# Patient Record
Sex: Male | Born: 1935 | Race: White | Hispanic: No | Marital: Married | Smoking: Former smoker
Health system: Southern US, Community
[De-identification: ages and names within clinical notes are randomized; demographics above are authoritative.]

## PROBLEM LIST (undated history)

## (undated) DIAGNOSIS — H3530 Unspecified macular degeneration: Secondary | ICD-10-CM

## (undated) DIAGNOSIS — F32A Depression, unspecified: Secondary | ICD-10-CM

## (undated) DIAGNOSIS — G20A1 Parkinson's disease without dyskinesia, without mention of fluctuations: Secondary | ICD-10-CM

## (undated) DIAGNOSIS — I2510 Atherosclerotic heart disease of native coronary artery without angina pectoris: Secondary | ICD-10-CM

## (undated) DIAGNOSIS — I739 Peripheral vascular disease, unspecified: Secondary | ICD-10-CM

## (undated) DIAGNOSIS — H40059 Ocular hypertension, unspecified eye: Secondary | ICD-10-CM

## (undated) DIAGNOSIS — I10 Essential (primary) hypertension: Secondary | ICD-10-CM

## (undated) DIAGNOSIS — I2699 Other pulmonary embolism without acute cor pulmonale: Secondary | ICD-10-CM

## (undated) DIAGNOSIS — B029 Zoster without complications: Secondary | ICD-10-CM

## (undated) DIAGNOSIS — I714 Abdominal aortic aneurysm, without rupture, unspecified: Secondary | ICD-10-CM

## (undated) DIAGNOSIS — C801 Malignant (primary) neoplasm, unspecified: Secondary | ICD-10-CM

## (undated) DIAGNOSIS — C3490 Malignant neoplasm of unspecified part of unspecified bronchus or lung: Secondary | ICD-10-CM

## (undated) DIAGNOSIS — G20 Parkinson's disease: Secondary | ICD-10-CM

## (undated) DIAGNOSIS — J449 Chronic obstructive pulmonary disease, unspecified: Secondary | ICD-10-CM

## (undated) HISTORY — DX: Depression, unspecified: F32.A

## (undated) HISTORY — DX: Malignant (primary) neoplasm, unspecified: C80.1

## (undated) HISTORY — DX: Unspecified macular degeneration: H35.30

## (undated) HISTORY — DX: Peripheral vascular disease, unspecified: I73.9

## (undated) HISTORY — PX: OTHER SURGICAL HISTORY: SHX169

## (undated) HISTORY — DX: Malignant neoplasm of unspecified part of unspecified bronchus or lung: C34.90

## (undated) HISTORY — DX: Other pulmonary embolism without acute cor pulmonale: I26.99

## (undated) HISTORY — DX: Abdominal aortic aneurysm, without rupture, unspecified: I71.40

## (undated) HISTORY — DX: Chronic obstructive pulmonary disease, unspecified: J44.9

## (undated) HISTORY — PX: PROSTATE SURGERY: SHX751

## (undated) HISTORY — DX: Ocular hypertension, unspecified eye: H40.059

## (undated) HISTORY — DX: Abdominal aortic aneurysm, without rupture: I71.4

## (undated) HISTORY — DX: Atherosclerotic heart disease of native coronary artery without angina pectoris: I25.10

## (undated) HISTORY — DX: Essential (primary) hypertension: I10

---

## 2001-09-20 ENCOUNTER — Ambulatory Visit (HOSPITAL_COMMUNITY): Admission: RE | Admit: 2001-09-20 | Discharge: 2001-09-20 | Payer: Self-pay | Admitting: Urology

## 2001-09-20 ENCOUNTER — Encounter: Payer: Self-pay | Admitting: Urology

## 2015-06-21 ENCOUNTER — Encounter: Payer: Self-pay | Admitting: Vascular Surgery

## 2015-06-27 ENCOUNTER — Encounter: Payer: Self-pay | Admitting: Vascular Surgery

## 2015-06-29 ENCOUNTER — Ambulatory Visit (INDEPENDENT_AMBULATORY_CARE_PROVIDER_SITE_OTHER): Payer: No Typology Code available for payment source | Admitting: Vascular Surgery

## 2015-06-29 ENCOUNTER — Encounter: Payer: Self-pay | Admitting: Vascular Surgery

## 2015-06-29 VITALS — BP 144/78 | HR 48 | Temp 97.3°F | Resp 18 | Ht 75.0 in | Wt 183.0 lb

## 2015-06-29 DIAGNOSIS — I714 Abdominal aortic aneurysm, without rupture, unspecified: Secondary | ICD-10-CM

## 2015-06-29 DIAGNOSIS — I701 Atherosclerosis of renal artery: Secondary | ICD-10-CM | POA: Diagnosis not present

## 2015-06-29 NOTE — Progress Notes (Signed)
[facility]ist of [place]  Patient name: Luke Quinn MRN: [id number] DOB: January 04, 1934 Sex: male  REASON FOR CONSULT: Abdominal aortic aneurysm and right renal artery stenosis  HPI: Luke Quinn is a 81 y.o. male, who is is referred with an abdominal aortic aneurysm and a right renal artery stenosis. He has been followed at the [place] but recently switched to the [place] and [place]. He tells me that he has had a known aneurysm for many years. He had a follow up study done on 06/07/2015 which showed a 5.2 cm saccular infrarenal aneurysm and also evidence of a right renal artery stenosis. He sent for vascular consultation.  He denies any acute onset abdominal pain or back pain. He does have a family history of aneurysmal disease. His brother had an abdominal aortic aneurysm.  He tells me that his blood pressure has been well controlled on his current medications.  Past Medical History  Diagnosis Date  . Hypertension   . Cancer [facility])     prostate  . Macular degeneration, age related   . COPD (chronic obstructive pulmonary disease) ([place])   . AAA (abdominal aortic aneurysm) ([facility])     Family History  Problem Relation Age of Onset  . Diabetes Mother   . Cancer Mother   . Hypertension Mother   . Diabetes Father    He denies any family history of premature cardiovascular disease.  SOCIAL HISTORY: Social History   Social History  . Marital Status: Married    Spouse Name: N/A  . Number of Children: N/A  . Years of Education: N/A   Occupational History  . Not on file.   Social History Main Topics  . Smoking status: Former Smoker    Quit date: 06/21/1995  . Smokeless tobacco: Not on file  . Alcohol Use: No  . Drug Use: No  . Sexual Activity: Not on file   Other Topics Concern  . Not on file   Social History Narrative    No Known Allergies  Current Outpatient Prescriptions  Medication Sig Dispense Refill  . ALBUTEROL IN Inhale 90 mcg into the lungs 4  (four) times daily as needed.    Marland Kitchen amLODipine (NORVASC) 10 MG tablet Take 10 mg by mouth daily.    Marland Kitchen atorvastatin (LIPITOR) 40 MG tablet Take 40 mg by mouth daily.    . Carboxymethylcellulose Sodium 0.25 % SOLN Apply 2 drops to eye QID.    Marland Kitchen clonazePAM (KLONOPIN) 0.5 MG tablet Take 0.5 mg by mouth at bedtime.    Marland Kitchen HYDRALAZINE HCL PO Take 25 mg by mouth 3 (three) times daily.    . hydrochlorothiazide (HYDRODIURIL) 25 MG tablet Take 25 mg by mouth daily.    Marland Kitchen ibuprofen (ADVIL,MOTRIN) 800 MG tablet Take 800 mg by mouth every 6 (six) hours as needed.    . lidocaine (LMX) 4 % cream Apply 1 application topically 3 (three) times daily as needed. Apply small amount to affected area three times a day as needed for back pain  Per medication list from [place]    . losartan (COZAAR) 100 MG tablet Take 100 mg by mouth daily.    . metoprolol (LOPRESSOR) 50 MG tablet Take 50 mg by mouth 2 (two) times daily.    . Multiple Vitamins-Minerals (ICAPS AREDS 2 PO) Take by mouth 2 (two) times daily.    Marland Kitchen omeprazole (PRILOSEC) 20 MG capsule Take 20 mg by mouth 2 (two) times daily before a meal.    .  POTASSIUM CHLORIDE PO Take 20 mEq by mouth 2 (two) times daily.    . traZODone (DESYREL) 50 MG tablet Take 50 mg by mouth at bedtime.     No current facility-administered medications for this visit.    REVIEW OF SYSTEMS:  '[X]'$  denotes positive finding, '[ ]'$  denotes negative finding Cardiac  Comments:  Chest pain or chest pressure:    Shortness of breath upon exertion: X   Short of breath when lying flat:    Irregular heart rhythm:        Vascular    Pain in calf, thigh, or hip brought on by ambulation: X   Pain in feet at night that wakes you up from your sleep:     Blood clot in your veins:    Leg swelling:         Pulmonary    Oxygen at home:    Productive cough:     Wheezing:         Neurologic    Sudden weakness in arms or legs:     Sudden numbness in arms or legs:     Sudden onset of difficulty speaking or  slurred speech:    Temporary loss of vision in one eye:     Problems with dizziness:         Gastrointestinal    Blood in stool:     Vomited blood:         Genitourinary    Burning when urinating:     Blood in urine:        Psychiatric    Major depression:         Hematologic    Bleeding problems:    Problems with blood clotting too easily:        Skin    Rashes or ulcers:        Constitutional    Fever or chills:      PHYSICAL EXAM: Filed Vitals:   06/29/15 1030 06/29/15 1031  BP: 155/70 144/78  Pulse: 47 48  Temp: 97.3 F (36.3 C)   Resp: 18   Height: '6\' 3"'$  (1.905 m)   Weight: 183 lb (83.008 kg)   SpO2: 100%     GENERAL: The patient is a well-nourished male, in no acute distress. The vital signs are documented above. CARDIAC: There is a regular rate and rhythm.  VASCULAR: I do not detect carotid bruits. He has palpable femoral, popliteal, and posterior tibial pulses bilaterally. He has mild bilateral lower extremity swelling. PULMONARY: There is good air exchange bilaterally without wheezing or rales. ABDOMEN: Soft and non-tender with normal pitched bowel sounds. His aneurysm is nontender. MUSCULOSKELETAL: There are no major deformities or cyanosis. NEUROLOGIC: No focal weakness or paresthesias are detected. SKIN: He does have some hyperpigmentation bilaterally consistent with chronic venous insufficiency. PSYCHIATRIC: The patient has a normal affect.  DATA:  I have reviewed his CT scan. He does have a saccular aneurysm in the midportion of his abdominal aorta which measures 5.2 cm in maximum diameter. He also appears to have a stenosis in the proximal right renal artery. He is also noted to have some small pulmonary nodules  MEDICAL ISSUES:  ABDOMINAL AORTIC ANEURYSM: The patient has a 5.2 cm saccular infrarenal abdominal aortic aneurysm. I have explained that in a normal risk patient we would consider elective repair at 5.5 cm. However, given that the  aneurysm is saccular and slightly more concerned about it and think we should consider earlier repair. I  have also discussed the option of a follow up study in 3 months however he agrees that he is comfortable proceeding with repair at this point. Based on his CAT scan, I think he does appear to be a candidate for an endovascular repair which would be ideal given his age. I do not think we would address the renal artery stenosis at the time of placement of the stent graft. This would certainly be an option in the future if his blood pressure became more difficult to control and I do not think the stent graft would limit that option of addressing this with angioplasty and stenting. We have discussed endovascular repair of the aneurysm and the potential complications. I recommended that we proceed with cardiac workup and his family will work this out with his physician at the [place]. Once he is cleared I'm happy to discuss surgery with him again and we can schedule endovascular repair of his aneurysm.   Deitra Mayo [facility] [place]: [phone]

## 2015-06-29 NOTE — Progress Notes (Signed)
Filed Vitals:   06/29/15 1030 06/29/15 1031  BP: 155/70 144/78  Pulse: 47 48  Temp: 97.3 F (36.3 C)   Resp: 18   Height: '6\' 3"'$  (1.905 m)   Weight: 183 lb (83.008 kg)

## 2015-07-11 ENCOUNTER — Encounter: Payer: Self-pay | Admitting: Infectious Diseases

## 2015-09-20 DIAGNOSIS — J449 Chronic obstructive pulmonary disease, unspecified: Secondary | ICD-10-CM | POA: Insufficient documentation

## 2015-09-20 DIAGNOSIS — I2510 Atherosclerotic heart disease of native coronary artery without angina pectoris: Secondary | ICD-10-CM | POA: Insufficient documentation

## 2015-09-20 DIAGNOSIS — I714 Abdominal aortic aneurysm, without rupture, unspecified: Secondary | ICD-10-CM | POA: Insufficient documentation

## 2015-09-20 DIAGNOSIS — I10 Essential (primary) hypertension: Secondary | ICD-10-CM | POA: Insufficient documentation

## 2015-10-05 ENCOUNTER — Telehealth: Payer: Self-pay

## 2015-10-05 NOTE — Telephone Encounter (Signed)
Phone call to pt. In follow-up to evaluation in office on 06/29/15, and plan to have pt. Receive cardiac clearance through the [facility], prior to scheduling an EVAR.  Spoke with pt's wife.  Per pt's wife, his PCP sent him to [place], [place], and he had the EVAR done there.

## 2015-12-27 ENCOUNTER — Encounter: Payer: Self-pay | Admitting: Infectious Diseases

## 2018-05-13 DIAGNOSIS — C3491 Malignant neoplasm of unspecified part of right bronchus or lung: Secondary | ICD-10-CM | POA: Insufficient documentation

## 2018-07-01 DIAGNOSIS — C3491 Malignant neoplasm of unspecified part of right bronchus or lung: Secondary | ICD-10-CM | POA: Insufficient documentation

## 2018-08-02 ENCOUNTER — Encounter: Payer: Self-pay | Admitting: *Deleted

## 2018-08-03 ENCOUNTER — Ambulatory Visit: Payer: Medicare PPO | Admitting: Cardiovascular Disease

## 2018-09-14 ENCOUNTER — Ambulatory Visit (INDEPENDENT_AMBULATORY_CARE_PROVIDER_SITE_OTHER): Payer: No Typology Code available for payment source | Admitting: Cardiovascular Disease

## 2018-09-14 ENCOUNTER — Encounter: Payer: Self-pay | Admitting: Cardiovascular Disease

## 2018-09-14 VITALS — BP 142/68 | HR 67 | Ht 75.0 in | Wt 166.0 lb

## 2018-09-14 DIAGNOSIS — E785 Hyperlipidemia, unspecified: Secondary | ICD-10-CM | POA: Diagnosis not present

## 2018-09-14 DIAGNOSIS — Z955 Presence of coronary angioplasty implant and graft: Secondary | ICD-10-CM | POA: Diagnosis not present

## 2018-09-14 DIAGNOSIS — I739 Peripheral vascular disease, unspecified: Secondary | ICD-10-CM

## 2018-09-14 DIAGNOSIS — I10 Essential (primary) hypertension: Secondary | ICD-10-CM

## 2018-09-14 DIAGNOSIS — IMO0001 Reserved for inherently not codable concepts without codable children: Secondary | ICD-10-CM

## 2018-09-14 DIAGNOSIS — T82330D Leakage of aortic (bifurcation) graft (replacement), subsequent encounter: Secondary | ICD-10-CM

## 2018-09-14 DIAGNOSIS — I25118 Atherosclerotic heart disease of native coronary artery with other forms of angina pectoris: Secondary | ICD-10-CM | POA: Diagnosis not present

## 2018-09-14 NOTE — Progress Notes (Signed)
CARDIOLOGY CONSULT NOTE  Patient ID: Luke Quinn MRN: [id number] DOB/AGE: January 01, 1934 84 y.o.  Admit date: (Not on file) Primary Physician: Charlyne Petrin, MD Referring Physician: Charlyne Petrin, MD  Reason for Consultation: Coronary artery disease  HPI: Luke Quinn is a 84 y.o. male who is being seen today for the evaluation of coronary artery disease at the request of Charlyne Petrin, MD.  He has a history of peripheral vascular disease.  He has an abdominal aortic aneurysm status post EVAR on 08/13/2015.  Lower extremity arterial duplex studies on 09/18/2016 showed arterial obstruction involving the tibial peroneal vessels in the right leg and left leg.  I reviewed hospitalization records from May 2019.  Labs showed BUN 16, creatinine 0.78, normal TSH, normal CBC.  I personally reviewed an ECG which demonstrated sinus rhythm with nonspecific IVCD.  ECG performed in the office today which I ordered and personally interpreted demonstrates normal sinus rhythm with frequent PACs, probable LVH with repolarization abnormalities, and inferolateral T wave abnormalities/inversions.  He is here with his wife, Luke Quinn.  It appears he has a history of coronary artery disease with 6 coronary stents placed nearly 20 years ago.  He apparently has not had any issues with his heart since that time.  He reportedly underwent an echocardiogram either earlier this month or late November.  I will have to request a copy of this.  He currently denies chest pain.  He is fairly sedentary.  He was diagnosed with Parkinson's disease in the last several months.  He uses a walker and a scooter to get around.  His wife said he has survived both lung and prostate cancer.  They have 2 daughters, 1 of whom works for [employer] and another who lives in [place].  No Known Allergies  Current Outpatient Medications  Medication Sig Dispense Refill  . ALBUTEROL IN Inhale 90 mcg into the lungs 4 (four) times  daily as needed.    Marland Kitchen atorvastatin (LIPITOR) 40 MG tablet Take 40 mg by mouth daily.    . carbidopa-levodopa (SINEMET IR) 25-100 MG tablet Take 1 tablet by mouth 3 (three) times daily.    . Carboxymethylcellulose Sodium 0.25 % SOLN Apply 2 drops to eye QID.    Marland Kitchen diclofenac (VOLTAREN) 50 MG EC tablet Take 50 mg by mouth 3 (three) times daily.    . hydrALAZINE (APRESOLINE) 100 MG tablet Take 100 mg by mouth 2 (two) times daily.    . hydrochlorothiazide (HYDRODIURIL) 25 MG tablet Take 25 mg by mouth daily.    Marland Kitchen lidocaine (LMX) 4 % cream Apply 1 application topically 3 (three) times daily as needed. Apply small amount to affected area three times a day as needed for back pain  Per medication list from [place]    . losartan (COZAAR) 100 MG tablet Take 100 mg by mouth daily.    . metoprolol tartrate (LOPRESSOR) 25 MG tablet Take 12.5 mg by mouth 2 (two) times daily.    . Multiple Vitamins-Minerals (ICAPS AREDS 2 PO) Take by mouth 2 (two) times daily.    Marland Kitchen omega-3 acid ethyl esters (LOVAZA) 1 g capsule Take 1 g by mouth daily.    Marland Kitchen omeprazole (PRILOSEC) 20 MG capsule Take 20 mg by mouth 2 (two) times daily before a meal.    . POTASSIUM CHLORIDE PO Take 20 mEq by mouth 2 (two) times daily.    . rasagiline (AZILECT) 0.5 MG TABS tablet Take 0.5 mg by mouth daily.  No current facility-administered medications for this visit.     Past Medical History:  Diagnosis Date  . AAA (abdominal aortic aneurysm) ([place])   . Cancer [facility])    prostate  . COPD (chronic obstructive pulmonary disease) ([place])   . Hypertension   . Macular degeneration, age related     Past Surgical History:  Procedure Laterality Date  . cardiac stents    . PROSTATE SURGERY      Social History   Socioeconomic History  . Marital status: Married    Spouse name: Not on file  . Number of children: Not on file  . Years of education: Not on file  . Highest education level: Not on file  Occupational History  . Not on file  Social  Needs  . Financial resource strain: Not on file  . Food insecurity:    Worry: Not on file    Inability: Not on file  . Transportation needs:    Medical: Not on file    Non-medical: Not on file  Tobacco Use  . Smoking status: Former Smoker    Last attempt to quit: 06/21/1995    Years since quitting: 23.2  . Smokeless tobacco: Never Used  Substance and Sexual Activity  . Alcohol use: No    Alcohol/week: 0.0 standard drinks  . Drug use: No  . Sexual activity: Not on file  Lifestyle  . Physical activity:    Days per week: Not on file    Minutes per session: Not on file  . Stress: Not on file  Relationships  . Social connections:    Talks on phone: Not on file    Gets together: Not on file    Attends religious service: Not on file    Active member of club or organization: Not on file    Attends meetings of clubs or organizations: Not on file    Relationship status: Not on file  . Intimate partner violence:    Fear of current or ex partner: Not on file    Emotionally abused: Not on file    Physically abused: Not on file    Forced sexual activity: Not on file  Other Topics Concern  . Not on file  Social History Narrative  . Not on file     No family history of premature CAD in 1st degree relatives.  Current Meds  Medication Sig  . ALBUTEROL IN Inhale 90 mcg into the lungs 4 (four) times daily as needed.  Marland Kitchen atorvastatin (LIPITOR) 40 MG tablet Take 40 mg by mouth daily.  . carbidopa-levodopa (SINEMET IR) 25-100 MG tablet Take 1 tablet by mouth 3 (three) times daily.  . Carboxymethylcellulose Sodium 0.25 % SOLN Apply 2 drops to eye QID.  Marland Kitchen diclofenac (VOLTAREN) 50 MG EC tablet Take 50 mg by mouth 3 (three) times daily.  . hydrALAZINE (APRESOLINE) 100 MG tablet Take 100 mg by mouth 2 (two) times daily.  . hydrochlorothiazide (HYDRODIURIL) 25 MG tablet Take 25 mg by mouth daily.  Marland Kitchen lidocaine (LMX) 4 % cream Apply 1 application topically 3 (three) times daily as needed. Apply  small amount to affected area three times a day as needed for back pain  Per medication list from [place]  . losartan (COZAAR) 100 MG tablet Take 100 mg by mouth daily.  . metoprolol tartrate (LOPRESSOR) 25 MG tablet Take 12.5 mg by mouth 2 (two) times daily.  . Multiple Vitamins-Minerals (ICAPS AREDS 2 PO) Take by mouth 2 (two) times daily.  Marland Kitchen  omega-3 acid ethyl esters (LOVAZA) 1 g capsule Take 1 g by mouth daily.  Marland Kitchen omeprazole (PRILOSEC) 20 MG capsule Take 20 mg by mouth 2 (two) times daily before a meal.  . POTASSIUM CHLORIDE PO Take 20 mEq by mouth 2 (two) times daily.  . rasagiline (AZILECT) 0.5 MG TABS tablet Take 0.5 mg by mouth daily.      Review of systems complete and found to be negative unless listed above in HPI    Physical exam Blood pressure (!) 142/68, pulse 67, height 6\' 3"  (1.905 m), weight 166 lb (75.3 kg), SpO2 98 %. General: NAD Neck: No JVD, no thyromegaly or thyroid nodule.  Lungs: Clear to auscultation bilaterally with normal respiratory effort. CV: Nondisplaced PMI. Regular rate and rhythm, normal S1/S2, no S3/S4, no murmur.  No peripheral edema.  No carotid bruit.    Abdomen: Soft, nontender, no distention.  Skin: Intact without lesions or rashes.  Neurologic: Alert and oriented x 3.  Psych: Normal affect. Extremities: No clubbing or cyanosis.  HEENT: Normal.   ECG: Most recent ECG reviewed.   Labs: No results found for: K, BUN, CREATININE, ALT, TSH, HGB   Lipids: No results found for: LDLCALC, LDLDIRECT, CHOL, TRIG, HDL      ASSESSMENT AND PLAN:  1.  PVD: Abdominal aortic aneurysm status post EVAR on 08/13/2015.  Also with lower extremity disease with arterial Dopplers reviewed above.  Currently on statin therapy.  2.  Hypertension: Blood pressure is mildly elevated.  I will monitor.  3.  Hyperlipidemia: Currently on statin therapy.  I will request a copy of lipids.  4.  Coronary artery disease: Reportedly has 6 coronary artery stents.  He is  asymptomatic at rest.  He is on aspirin, Lopressor, and Lipitor.  I will request a copy of the echocardiogram performed this month.   Disposition: Follow up in 6 months  Signed: Kate Sable, M.D., F.A.C.C.  09/14/2018, 1:54 PM

## 2018-09-14 NOTE — Patient Instructions (Signed)

## 2018-09-17 ENCOUNTER — Encounter: Payer: Self-pay | Admitting: *Deleted

## 2020-05-03 ENCOUNTER — Other Ambulatory Visit: Payer: Self-pay

## 2020-05-03 ENCOUNTER — Emergency Department (HOSPITAL_COMMUNITY)
Admission: EM | Admit: 2020-05-03 | Discharge: 2020-05-03 | Disposition: A | Discharge status: Home / Self Care | Payer: No Typology Code available for payment source | Attending: Emergency Medicine | Admitting: Emergency Medicine

## 2020-05-03 ENCOUNTER — Encounter (HOSPITAL_COMMUNITY): Payer: Self-pay | Admitting: Emergency Medicine

## 2020-05-03 ENCOUNTER — Emergency Department (HOSPITAL_COMMUNITY): Payer: No Typology Code available for payment source

## 2020-05-03 DIAGNOSIS — Z87891 Personal history of nicotine dependence: Secondary | ICD-10-CM | POA: Insufficient documentation

## 2020-05-03 DIAGNOSIS — R0789 Other chest pain: Secondary | ICD-10-CM | POA: Insufficient documentation

## 2020-05-03 DIAGNOSIS — R05 Cough: Secondary | ICD-10-CM | POA: Insufficient documentation

## 2020-05-03 DIAGNOSIS — R059 Cough, unspecified: Secondary | ICD-10-CM

## 2020-05-03 DIAGNOSIS — C61 Malignant neoplasm of prostate: Secondary | ICD-10-CM | POA: Insufficient documentation

## 2020-05-03 DIAGNOSIS — J449 Chronic obstructive pulmonary disease, unspecified: Secondary | ICD-10-CM | POA: Insufficient documentation

## 2020-05-03 DIAGNOSIS — B029 Zoster without complications: Secondary | ICD-10-CM | POA: Diagnosis not present

## 2020-05-03 DIAGNOSIS — R21 Rash and other nonspecific skin eruption: Secondary | ICD-10-CM | POA: Insufficient documentation

## 2020-05-03 DIAGNOSIS — Z20822 Contact with and (suspected) exposure to covid-19: Secondary | ICD-10-CM | POA: Insufficient documentation

## 2020-05-03 DIAGNOSIS — I10 Essential (primary) hypertension: Secondary | ICD-10-CM | POA: Diagnosis not present

## 2020-05-03 HISTORY — DX: Zoster without complications: B02.9

## 2020-05-03 HISTORY — DX: Parkinson's disease without dyskinesia, without mention of fluctuations: G20.A1

## 2020-05-03 HISTORY — DX: Parkinson's disease: G20

## 2020-05-03 LAB — CBC
HCT: 42.5 % (ref 39.0–52.0)
Hemoglobin: 13.3 g/dL (ref 13.0–17.0)
MCH: 28.7 pg (ref 26.0–34.0)
MCHC: 31.3 g/dL (ref 30.0–36.0)
MCV: 91.8 fL (ref 80.0–100.0)
Platelets: 176 10*3/uL (ref 150–400)
RBC: 4.63 MIL/uL (ref 4.22–5.81)
RDW: 14.1 % (ref 11.5–15.5)
WBC: 7.5 10*3/uL (ref 4.0–10.5)
nRBC: 0 % (ref 0.0–0.2)

## 2020-05-03 LAB — BASIC METABOLIC PANEL
Anion gap: 10 (ref 5–15)
BUN: 34 mg/dL — ABNORMAL HIGH (ref 8–23)
CO2: 24 mmol/L (ref 22–32)
Calcium: 10.4 mg/dL — ABNORMAL HIGH (ref 8.9–10.3)
Chloride: 103 mmol/L (ref 98–111)
Creatinine, Ser: 1.18 mg/dL (ref 0.61–1.24)
GFR calc Af Amer: >60 mL/min (ref >60)
GFR calc non Af Amer: 56 mL/min — ABNORMAL LOW (ref >60)
Glucose, Bld: 104 mg/dL — ABNORMAL HIGH (ref 70–99)
Potassium: 3.5 mmol/L (ref 3.5–5.1)
Sodium: 137 mmol/L (ref 135–145)

## 2020-05-03 LAB — BRAIN NATRIURETIC PEPTIDE: B Natriuretic Peptide: 76 pg/mL (ref 0.0–100.0)

## 2020-05-03 LAB — SARS CORONAVIRUS 2 BY RT PCR (HOSPITAL ORDER, PERFORMED IN ~~LOC~~ HOSPITAL LAB): SARS Coronavirus 2: NEGATIVE

## 2020-05-03 NOTE — ED Provider Notes (Signed)
[facility] EMERGENCY DEPARTMENT Provider Note   CSN: [id number] Arrival date & time: 05/03/20  1341     History Chief Complaint  Patient presents with  . Cough    Luke Quinn is a 86 y.o. male.  HPI   Patient presents to the ED for evaluation of a cough.  Symptoms have been ongoing for the past week.  Patient has had a cough productive of white sputum.  Patient has been listless and weak according to the patient's wife.  Patient is currently getting treatment for herpes zoster.  He has active rash on his left chest that is causing some pain for him.  Patient denies any shortness of breath.  No known fevers.  No vomiting or diarrhea.  Past Medical History:  Diagnosis Date  . AAA (abdominal aortic aneurysm) ([place])   . Cancer [facility])    prostate  . COPD (chronic obstructive pulmonary disease) ([place])   . Hypertension   . Macular degeneration, age related   . Parkinson's disease ([place])   . Shingles     There are no problems to display for this patient.   Past Surgical History:  Procedure Laterality Date  . cardiac stents    . PROSTATE SURGERY         Family History  Problem Relation Age of Onset  . Diabetes Mother   . Cancer Mother   . Hypertension Mother   . Diabetes Father     Social History   Tobacco Use  . Smoking status: Former Smoker    Quit date: 06/21/1995    Years since quitting: 24.8  . Smokeless tobacco: Never Used  Substance Use Topics  . Alcohol use: No    Alcohol/week: 0.0 standard drinks  . Drug use: No    Home Medications Prior to Admission medications   Medication Sig Start Date End Date Taking? Authorizing Provider  carbidopa-levodopa (SINEMET IR) 25-100 MG tablet Take 1 tablet by mouth 3 (three) times daily.   Yes [provider]  Carboxymethylcellulose Sodium 0.25 % SOLN Apply 2 drops to eye QID.   Yes [provider]  diclofenac (VOLTAREN) 50 MG EC tablet Take 50 mg by mouth 3 (three) times daily.   Yes [provider]  hydrALAZINE (APRESOLINE) 100 MG tablet Take 100 mg by mouth 2 (two) times daily.   Yes [provider]  hydrochlorothiazide (HYDRODIURIL) 25 MG tablet Take 25 mg by mouth daily.   Yes [provider]  lidocaine (LMX) 4 % cream Apply 1 application topically 3 (three) times daily as needed. Apply small amount to affected area three times a day as needed for back pain  Per medication list from [place]   Yes [provider]  losartan (COZAAR) 100 MG tablet Take 100 mg by mouth daily.   Yes [provider]  Multiple Vitamins-Minerals (ICAPS AREDS 2 PO) Take by mouth 2 (two) times daily.   Yes [provider]  omega-3 acid ethyl esters (LOVAZA) 1 g capsule Take 1 g by mouth daily.   Yes [provider]  omeprazole (PRILOSEC) 20 MG capsule Take 20 mg by mouth 2 (two) times daily before a meal.   Yes [provider]  POTASSIUM CHLORIDE PO Take 20 mEq by mouth 2 (two) times daily.   Yes [provider]  rasagiline (AZILECT) 0.5 MG TABS tablet Take 0.5 mg by mouth daily.   Yes [provider]  ALBUTEROL IN Inhale 90 mcg into the lungs 4 (four)  times daily as needed.    [provider]  atorvastatin (LIPITOR) 40 MG tablet Take 40 mg by mouth daily.    [provider]  metoprolol tartrate (LOPRESSOR) 25 MG tablet Take 12.5 mg by mouth 2 (two) times daily.    [provider]    Allergies    Gabapentin  Review of Systems   Review of Systems  All other systems reviewed and are negative.   Physical Exam Updated Vital Signs BP (!) 176/98   Pulse 60   Temp 98.3 F (36.8 C) (Oral)   Resp (!) 21   Ht 1.905 m (6\' 3" )   Wt 75.3 kg   SpO2 99%   BMI 20.75 kg/m   Physical Exam Vitals and nursing note reviewed.  Constitutional:      Appearance: He is well-developed. He is not toxic-appearing or diaphoretic.  HENT:     Head: Normocephalic and atraumatic.     Right Ear: External ear  normal.     Left Ear: External ear normal.  Eyes:     General: No scleral icterus.       Right eye: No discharge.        Left eye: No discharge.     Conjunctiva/sclera: Conjunctivae normal.  Neck:     Trachea: No tracheal deviation.  Cardiovascular:     Rate and Rhythm: Normal rate and regular rhythm.  Pulmonary:     Effort: Pulmonary effort is normal. No respiratory distress.     Breath sounds: Normal breath sounds. No stridor. No wheezing or rales.  Abdominal:     General: Bowel sounds are normal. There is no distension.     Palpations: Abdomen is soft.     Tenderness: There is no abdominal tenderness. There is no guarding or rebound.  Musculoskeletal:        General: No tenderness.     Cervical back: Neck supple.  Skin:    General: Skin is warm and dry.     Findings: Rash present.     Comments: Vesicular rash on the left chest  Neurological:     Mental Status: He is alert.     Cranial Nerves: No cranial nerve deficit (no facial droop, extraocular movements intact, no slurred speech).     Sensory: No sensory deficit.     Motor: No abnormal muscle tone or seizure activity.     Coordination: Coordination normal.     ED Results / Procedures / Treatments   Labs (all labs ordered are listed, but only abnormal results are displayed) Labs Reviewed  BASIC METABOLIC PANEL - Abnormal; Notable for the following components:      Result Value   Glucose, Bld 104 (*)    BUN 34 (*)    Calcium 10.4 (*)    GFR calc non Af Amer 56 (*)    All other components within normal limits  SARS CORONAVIRUS 2 BY RT PCR (HOSPITAL ORDER, [place] [facility])  CBC  BRAIN NATRIURETIC PEPTIDE    EKG EKG Interpretation  Date/Time:  Thursday May 03 2020 14:45:26 EDT Ventricular Rate:  74 PR Interval:    QRS Duration: 108 QT Interval:  366 QTC Calculation: 406 R Axis:   -65 Text Interpretation: Atrial fibrillation with premature ventricular or aberrantly conducted  complexes Left axis deviation Minimal voltage criteria for LVH, may be normal variant ( Cornell product ) Anterior infarct , age undetermined Abnormal ECG No old tracing to compare Confirmed by Dorie Rank [phone])  on 05/03/2020 6:44:13 PM   Radiology DG Chest 2 View  Result Date: 05/03/2020 CLINICAL DATA:  86 year old male with cough. EXAM: CHEST - 2 VIEW COMPARISON:  None. FINDINGS: No focal consolidation, pleural effusion, or pneumothorax. Top-normal cardiac size. Atherosclerotic calcification of the aorta. No acute osseous pathology. IMPRESSION: No active cardiopulmonary disease. Electronically Signed   By: Anner Crete M.D.   On: 05/03/2020 19:31    Procedures Procedures (including critical care time)  Medications Ordered in ED Medications - No data to display  ED Course  I have reviewed the triage vital signs and the nursing notes.  Pertinent labs & imaging results that were available during my care of the patient were reviewed by me and considered in my medical decision making (see chart for details).    MDM Rules/Calculators/A&P                          Patient presented to the ED for evaluation of a cough.  Patient currently is being treated for shingles.  Patient's ED evaluation is reassuring.  Laboratory test show elevated BUN but no other significant laboratory abnormalities.  Chest x-ray does not show pneumonia.  Oxygen saturation is normal.  Patient has not had any respiratory difficulty.  Discussed findings with patient and his family member.  Patient appears stable for discharge. Final Clinical Impression(s) / ED Diagnoses Final diagnoses:  Cough  Herpes zoster without complication    Rx / DC Orders ED Discharge Orders    None       Dorie Rank, MD 05/03/20 2156

## 2020-05-03 NOTE — ED Triage Notes (Addendum)
Pt has had a cough for the last week. Spouse states the cough is productive and white in color. The spouse is concerned that he may have PNU due to lethargy and weakness. Pt is a lung CA survivor. Pt has herpes zoster on his chest without weeping.

## 2020-05-03 NOTE — Discharge Instructions (Signed)
Continue your current medications.  Follow-up with your doctor to be rechecked next week

## 2020-05-03 NOTE — ED Notes (Signed)
Called lab to inquire on blood draw- on way to draw labs now

## 2020-06-05 DIAGNOSIS — G20 Parkinson's disease: Secondary | ICD-10-CM | POA: Insufficient documentation

## 2020-06-05 DIAGNOSIS — G20A1 Parkinson's disease without dyskinesia, without mention of fluctuations: Secondary | ICD-10-CM | POA: Insufficient documentation

## 2020-10-16 DIAGNOSIS — I4891 Unspecified atrial fibrillation: Secondary | ICD-10-CM

## 2020-10-16 HISTORY — DX: Unspecified atrial fibrillation: I48.91

## 2020-10-23 ENCOUNTER — Emergency Department (HOSPITAL_COMMUNITY): Payer: No Typology Code available for payment source

## 2020-10-23 ENCOUNTER — Other Ambulatory Visit: Payer: Self-pay

## 2020-10-23 ENCOUNTER — Encounter (HOSPITAL_COMMUNITY): Payer: Self-pay | Admitting: *Deleted

## 2020-10-23 ENCOUNTER — Emergency Department (HOSPITAL_COMMUNITY)
Admission: EM | Admit: 2020-10-23 | Discharge: 2020-10-23 | Disposition: A | Discharge status: Home / Self Care | Payer: No Typology Code available for payment source | Attending: Emergency Medicine | Admitting: Emergency Medicine

## 2020-10-23 DIAGNOSIS — I10 Essential (primary) hypertension: Secondary | ICD-10-CM | POA: Diagnosis not present

## 2020-10-23 DIAGNOSIS — Z87891 Personal history of nicotine dependence: Secondary | ICD-10-CM | POA: Diagnosis not present

## 2020-10-23 DIAGNOSIS — Z79899 Other long term (current) drug therapy: Secondary | ICD-10-CM | POA: Diagnosis not present

## 2020-10-23 DIAGNOSIS — I2694 Multiple subsegmental pulmonary emboli without acute cor pulmonale: Secondary | ICD-10-CM | POA: Insufficient documentation

## 2020-10-23 DIAGNOSIS — Z85118 Personal history of other malignant neoplasm of bronchus and lung: Secondary | ICD-10-CM | POA: Diagnosis not present

## 2020-10-23 DIAGNOSIS — J449 Chronic obstructive pulmonary disease, unspecified: Secondary | ICD-10-CM | POA: Insufficient documentation

## 2020-10-23 DIAGNOSIS — Z8546 Personal history of malignant neoplasm of prostate: Secondary | ICD-10-CM | POA: Insufficient documentation

## 2020-10-23 DIAGNOSIS — G20 Parkinson's disease: Secondary | ICD-10-CM | POA: Diagnosis not present

## 2020-10-23 DIAGNOSIS — U071 COVID-19: Secondary | ICD-10-CM | POA: Insufficient documentation

## 2020-10-23 DIAGNOSIS — I2510 Atherosclerotic heart disease of native coronary artery without angina pectoris: Secondary | ICD-10-CM | POA: Insufficient documentation

## 2020-10-23 DIAGNOSIS — R197 Diarrhea, unspecified: Secondary | ICD-10-CM | POA: Diagnosis present

## 2020-10-23 LAB — URINALYSIS, ROUTINE W REFLEX MICROSCOPIC
Bacteria, UA: NONE SEEN
Bilirubin Urine: NEGATIVE
Glucose, UA: NEGATIVE mg/dL
Hgb urine dipstick: NEGATIVE
Ketones, ur: 5 mg/dL — AB
Leukocytes,Ua: NEGATIVE
Nitrite: NEGATIVE
Protein, ur: 30 mg/dL — AB
Specific Gravity, Urine: 1.024 (ref 1.005–1.030)
pH: 7 (ref 5.0–8.0)

## 2020-10-23 LAB — COMPREHENSIVE METABOLIC PANEL
ALT: 11 U/L (ref 0–44)
AST: 28 U/L (ref 15–41)
Albumin: 3.6 g/dL (ref 3.5–5.0)
Alkaline Phosphatase: 87 U/L (ref 38–126)
Anion gap: 10 (ref 5–15)
BUN: 20 mg/dL (ref 8–23)
CO2: 24 mmol/L (ref 22–32)
Calcium: 10.1 mg/dL (ref 8.9–10.3)
Chloride: 103 mmol/L (ref 98–111)
Creatinine, Ser: 0.82 mg/dL (ref 0.61–1.24)
GFR, Estimated: >60 mL/min (ref >60)
Glucose, Bld: 95 mg/dL (ref 70–99)
Potassium: 3.4 mmol/L — ABNORMAL LOW (ref 3.5–5.1)
Sodium: 137 mmol/L (ref 135–145)
Total Bilirubin: 0.8 mg/dL (ref 0.3–1.2)
Total Protein: 7.3 g/dL (ref 6.5–8.1)

## 2020-10-23 LAB — CBC
HCT: 42.1 % (ref 39.0–52.0)
Hemoglobin: 12.8 g/dL — ABNORMAL LOW (ref 13.0–17.0)
MCH: 27.5 pg (ref 26.0–34.0)
MCHC: 30.4 g/dL (ref 30.0–36.0)
MCV: 90.3 fL (ref 80.0–100.0)
Platelets: 247 10*3/uL (ref 150–400)
RBC: 4.66 MIL/uL (ref 4.22–5.81)
RDW: 14.9 % (ref 11.5–15.5)
WBC: 6.7 10*3/uL (ref 4.0–10.5)
nRBC: 0 % (ref 0.0–0.2)

## 2020-10-23 LAB — D-DIMER, QUANTITATIVE: D-Dimer, Quant: 2.41 ug/mL-FEU — ABNORMAL HIGH (ref 0.00–0.50)

## 2020-10-23 LAB — PROCALCITONIN: Procalcitonin: <0.1 ng/mL

## 2020-10-23 LAB — LIPASE, BLOOD: Lipase: 70 U/L — ABNORMAL HIGH (ref 11–51)

## 2020-10-23 LAB — FERRITIN: Ferritin: 345 ng/mL — ABNORMAL HIGH (ref 24–336)

## 2020-10-23 MED ORDER — IOHEXOL 350 MG/ML SOLN
100.0000 mL | Freq: Once | INTRAVENOUS | Status: AC | PRN
  Administered 2020-10-23: 100 mL via INTRAVENOUS

## 2020-10-23 MED ORDER — SODIUM CHLORIDE 0.9 % IV BOLUS
500.0000 mL | Freq: Once | INTRAVENOUS | Status: AC
  Administered 2020-10-23: 500 mL via INTRAVENOUS

## 2020-10-23 MED ORDER — APIXABAN 5 MG PO TABS: 5.0000 mg | ORAL_TABLET | Freq: Two times a day (BID) | ORAL | 0 refills | Status: AC

## 2020-10-23 MED ORDER — SODIUM CHLORIDE 0.9 % IV SOLN: INTRAVENOUS | Status: DC

## 2020-10-23 MED ORDER — APIXABAN 5 MG PO TABS
5.0000 mg | ORAL_TABLET | Freq: Once | ORAL | Status: AC
  Administered 2020-10-23: 5 mg via ORAL
  Filled 2020-10-23: qty 1

## 2020-10-23 MED ORDER — CARBIDOPA-LEVODOPA ER 25-100 MG PO TBCR
1.0000 | EXTENDED_RELEASE_TABLET | Freq: Two times a day (BID) | ORAL | Status: DC
  Administered 2020-10-23: 1 via ORAL
  Filled 2020-10-23: qty 1

## 2020-10-23 NOTE — ED Triage Notes (Signed)
covid positive, diarrhea for 3 days, history of copd

## 2020-10-23 NOTE — ED Provider Notes (Signed)
[facility] Provider Note   CSN: [id number] Arrival date & time: 10/23/20  1300     History No chief complaint on file.   Luke Quinn is a 87 y.o. male.  HPI Patient here for evaluation of diarrhea and Covid infection.  He had a positive home test for Covid, 4 days ago and that afternoon started taking molnupiravir.  He had Covid vaccines but not posted yet.  He has been having intermittent episodes of diarrhea over the last 3 days.  He is generally weak and his wife is noticed a congested cough.  He is taking his other medications as directed.  No other recent illnesses.  There are no other known modifying factors.    Past Medical History:  Diagnosis Date  . AAA (abdominal aortic aneurysm) ([place])   . Cancer [facility])    prostate  . COPD (chronic obstructive pulmonary disease) ([place])   . Hypertension   . Macular degeneration, age related   . Parkinson's disease ([place])   . Shingles     Patient Active Problem List   Diagnosis Date Noted  . Parkinson's disease ([place]) 06/05/2020  . Malignant neoplasm of right lung ([place]) 07/01/2018  . Non-small cell lung cancer, right ([place]) 05/13/2018  . Abdominal aortic aneurysm (AAA) without rupture ([place]) 09/20/2015  . Chronic obstructive pulmonary disease ([place]) 09/20/2015  . Coronary artery disease involving native heart without angina pectoris 09/20/2015  . Essential hypertension 09/20/2015    Past Surgical History:  Procedure Laterality Date  . cardiac stents    . PROSTATE SURGERY         Family History  Problem Relation Age of Onset  . Diabetes Mother   . Cancer Mother   . Hypertension Mother   . Diabetes Father     Social History   Tobacco Use  . Smoking status: Former Smoker    Quit date: 06/21/1995    Years since quitting: 25.3  . Smokeless tobacco: Never Used  Substance Use Topics  . Alcohol use: No    Alcohol/week: 0.0 standard drinks  . Drug use: No    Home Medications Prior to Admission  medications   Medication Sig Start Date End Date Taking? Authorizing Provider  ALBUTEROL IN Inhale 90 mcg into the lungs 4 (four) times daily as needed.    [provider]  atorvastatin (LIPITOR) 40 MG tablet Take 40 mg by mouth daily.    [provider]  carbidopa-levodopa (SINEMET IR) 25-100 MG tablet Take 1 tablet by mouth 3 (three) times daily.    [provider]  Carboxymethylcellulose Sodium 0.25 % SOLN Apply 2 drops to eye QID.    [provider]  diclofenac (VOLTAREN) 50 MG EC tablet Take 50 mg by mouth 3 (three) times daily.    [provider]  hydrALAZINE (APRESOLINE) 100 MG tablet Take 100 mg by mouth 2 (two) times daily.    [provider]  hydrochlorothiazide (HYDRODIURIL) 25 MG tablet Take 25 mg by mouth daily.    [provider]  lidocaine (LMX) 4 % cream Apply 1 application topically 3 (three) times daily as needed. Apply small amount to affected area three times a day as needed for back pain  Per medication list from [place]    [provider]  losartan (COZAAR) 100 MG tablet Take 100 mg by mouth daily.    [provider]  metoprolol tartrate (LOPRESSOR) 25 MG tablet Take 12.5 mg by mouth 2 (two) times daily.  [provider]  Multiple Vitamins-Minerals (ICAPS AREDS 2 PO) Take by mouth 2 (two) times daily.    [provider]  omega-3 acid ethyl esters (LOVAZA) 1 g capsule Take 1 g by mouth daily.    [provider]  omeprazole (PRILOSEC) 20 MG capsule Take 20 mg by mouth 2 (two) times daily before a meal.    [provider]  POTASSIUM CHLORIDE PO Take 20 mEq by mouth 2 (two) times daily.    [provider]  rasagiline (AZILECT) 0.5 MG TABS tablet Take 0.5 mg by mouth daily.    [provider]    Allergies    Gabapentin  Review of Systems   Review of Systems  All other systems reviewed and are negative.   Physical Exam Updated Vital Signs BP  (!) 149/95   Pulse 78   Temp 97.6 F (36.4 C) (Oral)   Resp 19   Ht 6\' 3"  (1.905 m)   Wt 73.5 kg   SpO2 99%   BMI 20.25 kg/m   Physical Exam Vitals and nursing note reviewed.  Constitutional:      Appearance: He is well-developed and well-nourished.  HENT:     Head: Normocephalic and atraumatic.     Right Ear: External ear normal.     Left Ear: External ear normal.  Eyes:     Extraocular Movements: EOM normal.     Conjunctiva/sclera: Conjunctivae normal.     Pupils: Pupils are equal, round, and reactive to light.  Neck:     Trachea: Phonation normal.  Cardiovascular:     Rate and Rhythm: Normal rate.     Pulses: Normal pulses.  Pulmonary:     Effort: Pulmonary effort is normal. No respiratory distress.     Breath sounds: Normal breath sounds. No stridor.     Comments: Congested cough, which is nonproductive.  There is no increased work of breathing Chest:     Chest wall: No bony tenderness.  Abdominal:     General: There is no distension.     Palpations: Abdomen is soft.     Tenderness: There is no abdominal tenderness.  Musculoskeletal:        General: Normal range of motion.     Cervical back: Normal range of motion and neck supple.  Skin:    General: Skin is warm, dry and intact.  Neurological:     Mental Status: He is alert.     Cranial Nerves: No cranial nerve deficit.     Sensory: No sensory deficit.     Motor: No abnormal muscle tone.     Coordination: Coordination normal.  Psychiatric:        Mood and Affect: Mood and affect and mood normal.        Behavior: Behavior normal.     ED Results / Procedures / Treatments   Labs (all labs ordered are listed, but only abnormal results are displayed) Labs Reviewed  LIPASE, BLOOD - Abnormal; Notable for the following components:      Result Value   Lipase 70 (*)    All other components within normal limits  COMPREHENSIVE METABOLIC PANEL - Abnormal; Notable for the following components:   Potassium 3.4 (*)     All other components within normal limits  CBC - Abnormal; Notable for the following components:   Hemoglobin 12.8 (*)    All other components within normal limits  D-DIMER, QUANTITATIVE (NOT AT [facility]) - Abnormal; Notable for the following components:  D-Dimer, Quant 2.41 (*)    All other components within normal limits  URINALYSIS, ROUTINE W REFLEX MICROSCOPIC  FERRITIN  PROCALCITONIN    EKG EKG Interpretation  Date/Time:  Tuesday October 23 2020 14:07:28 EST Ventricular Rate:  97 PR Interval:    QRS Duration: 88 QT Interval:  348 QTC Calculation: 441 R Axis:   -39 Text Interpretation: Atrial fibrillation with premature ventricular or aberrantly conducted complexes Left axis deviation Septal infarct , age undetermined Possible Lateral infarct , age undetermined Abnormal ECG since last tracing no significant change Confirmed by Daleen Bo [phone]) on 10/23/2020 4:08:28 PM   Radiology DG Chest Port 1 View  Result Date: 10/23/2020 CLINICAL DATA:  Diarrhea.  COVID-19 positive. EXAM: PORTABLE CHEST 1 VIEW COMPARISON:  Chest x-ray dated May 03, 2020. FINDINGS: The patient is rotated to the left. The heart is at the upper limits of normal in size. Mildly increased interstitial opacity at the lung bases. Scarring in the right upper lobe. No focal consolidation, pleural effusion, or pneumothorax. No acute osseous abnormality. IMPRESSION: 1. Mildly increased interstitial opacity at the lung bases, favored to reflect atypical infection given clinical history. Electronically Signed   By: Titus Dubin M.D.   On: 10/23/2020 14:59    Procedures .Critical Care Performed by: Daleen Bo, MD Authorized by: Daleen Bo, MD   Critical care provider statement:    Critical care time (minutes):  35   Critical care start time:  10/23/2020 3:00 PM   Critical care end time:  10/23/2020 8:10 PM   Critical care time was exclusive of:  Separately billable procedures and treating other  patients   Critical care was time spent personally by me on the following activities:  Blood draw for specimens, development of treatment plan with patient or surrogate, discussions with consultants, evaluation of patient's response to treatment, examination of patient, obtaining history from patient or surrogate, ordering and performing treatments and interventions, ordering and review of laboratory studies, pulse oximetry, re-evaluation of patient's condition, review of old charts and ordering and review of radiographic studies     Medications Ordered in ED Medications  sodium chloride 0.9 % bolus 500 mL (has no administration in time range)  0.9 %  sodium chloride infusion (has no administration in time range)    ED Course  I have reviewed the triage vital signs and the nursing notes.  Pertinent labs & imaging results that were available during my care of the patient were reviewed by me and considered in my medical decision making (see chart for details).  Clinical Course as of 10/24/20 1358  Tue Oct 23, 2020  1822 Hyaline Casts, UA: PRESENT [EW]    Clinical Course User Index [EW] Daleen Bo, MD   MDM Rules/Calculators/A&P                           Patient Vitals for the past 24 hrs:  BP Temp Temp src Pulse Resp SpO2 Height Weight  10/23/20 1630 (!) 149/95 -- -- 78 19 99 % -- --  10/23/20 1530 (!) 181/103 -- -- 77 18 98 % -- --  10/23/20 1407 (!) 152/99 97.6 F (36.4 C) Oral 72 18 98 % 6\' 3"  (1.905 m) 73.5 kg    At discharge- reevaluation with update and discussion. After initial assessment and treatment, an updated evaluation reveals no change in status, findings discussed with wife, all questions answered. Daleen Bo   Medical Decision Making:  This patient  is presenting for evaluation of complications of Covid infection including diarrhea, which does require a range of treatment options, and is a complaint that involves a high risk of morbidity and mortality. The  differential diagnoses include primary pneumonia, secondary pneumonia, metabolic disorder, medication intolerance, metabolic disorder. I decided to review old records, and in summary elderly male, with Covid infection, at least 4 days duration, currently on oral antiviral medication.  I obtained additional historical information from wife at bedside.  Clinical Laboratory Tests Ordered, included CBC, Metabolic panel and D-dimer, lipase, procalcitonin, ferritin, urine. Review indicates elevated D-dimer, lipase slightly high, potassium low, hemoglobin low. Radiologic Tests Ordered, included chest x-ray.  I independently Visualized: Radiographic images, which show basal pneumonia consistent with Covid infection    Critical Interventions-clinical evaluation, radiography, EKG, laboratory testing, observation reassessment  After These Interventions, the Patient was reevaluated and was found respiratory distress or hypoxia.  Patient has  expected findings from Covid infection, currently on oral antiviral medication, day #4.  Patient with elevated D-dimer and CT angiogram evidence for subsegmental PEs.  No respiratory distress distress.  I discussed treatment for PE with wife including risk and benefit of anticoagulation.  She was to proceed with anticoagulation, until she talks to his PCP further.  At this point there is no indication for hospitalization or additional change in therapeutic treatment.  Findings discussed with wife who is agreeable for discharge with outpatient management.  Luke Quinn was evaluated in Emergency Department on 10/24/2020 for the symptoms described in the history of present illness. He was evaluated in the context of the global COVID-19 pandemic, which necessitated consideration that the patient might be at risk for infection with the SARS-CoV-2 virus that causes COVID-19. Institutional protocols and algorithms that pertain to the evaluation of patients at risk for COVID-19 are  in a state of rapid change based on information released by regulatory bodies including the CDC and federal and state organizations. These policies and algorithms were followed during the patient's care in the ED.  CRITICAL CARE-yes Performed by: Daleen Bo  Nursing Notes Reviewed/ Care Coordinated Applicable Imaging Reviewed Interpretation of Laboratory Data incorporated into ED treatment  The patient appears reasonably screened and/or stabilized for discharge and I doubt any other medical condition or other [facility] requiring further screening, evaluation, or treatment in the ED at this time prior to discharge.  Plan: Home Medications-continue current treatment; Home Treatments-supportive care; return here if the recommended treatment, does not improve the symptoms; Recommended follow up-PCP for ongoing management in the next week or 2.     Final Clinical Impression(s) / ED Diagnoses Final diagnoses:  COVID-19 virus infection  Multiple subsegmental pulmonary emboli without acute cor pulmonale ([place])    Rx / DC Orders ED Discharge Orders    None       Daleen Bo, MD 10/24/20 1404

## 2020-10-23 NOTE — Discharge Instructions (Addendum)
The testing does not show serious problems associated with the Covid-19 infection.  He does have signs of a viral pneumonia, and some small pulmonary emboli of the right lower lung.  He does not have oxygen requirements, that would require hospitalization.  Continue the current treatment as planned.  We are adding a blood thinner/anticoagulant to treat pulmonary embolism.  Discuss ongoing treatment of this with his primary care doctor.  Continue all his other medications as usual.

## 2020-11-23 ENCOUNTER — Emergency Department (HOSPITAL_COMMUNITY)
Admission: EM | Admit: 2020-11-23 | Discharge: 2020-11-23 | Disposition: A | Discharge status: Home / Self Care | Payer: No Typology Code available for payment source | Attending: Emergency Medicine | Admitting: Emergency Medicine

## 2020-11-23 ENCOUNTER — Emergency Department (HOSPITAL_COMMUNITY): Payer: No Typology Code available for payment source

## 2020-11-23 ENCOUNTER — Encounter (HOSPITAL_COMMUNITY): Payer: Self-pay | Admitting: Emergency Medicine

## 2020-11-23 ENCOUNTER — Other Ambulatory Visit: Payer: Self-pay

## 2020-11-23 DIAGNOSIS — Z79899 Other long term (current) drug therapy: Secondary | ICD-10-CM | POA: Diagnosis not present

## 2020-11-23 DIAGNOSIS — I2510 Atherosclerotic heart disease of native coronary artery without angina pectoris: Secondary | ICD-10-CM | POA: Diagnosis not present

## 2020-11-23 DIAGNOSIS — R509 Fever, unspecified: Secondary | ICD-10-CM | POA: Diagnosis present

## 2020-11-23 DIAGNOSIS — Z20822 Contact with and (suspected) exposure to covid-19: Secondary | ICD-10-CM | POA: Insufficient documentation

## 2020-11-23 DIAGNOSIS — Z8546 Personal history of malignant neoplasm of prostate: Secondary | ICD-10-CM | POA: Diagnosis not present

## 2020-11-23 DIAGNOSIS — Z87891 Personal history of nicotine dependence: Secondary | ICD-10-CM | POA: Insufficient documentation

## 2020-11-23 DIAGNOSIS — J449 Chronic obstructive pulmonary disease, unspecified: Secondary | ICD-10-CM | POA: Diagnosis not present

## 2020-11-23 DIAGNOSIS — G20 Parkinson's disease: Secondary | ICD-10-CM | POA: Diagnosis not present

## 2020-11-23 DIAGNOSIS — Z7901 Long term (current) use of anticoagulants: Secondary | ICD-10-CM | POA: Diagnosis not present

## 2020-11-23 DIAGNOSIS — Z85118 Personal history of other malignant neoplasm of bronchus and lung: Secondary | ICD-10-CM | POA: Diagnosis not present

## 2020-11-23 DIAGNOSIS — D72829 Elevated white blood cell count, unspecified: Secondary | ICD-10-CM | POA: Insufficient documentation

## 2020-11-23 DIAGNOSIS — I10 Essential (primary) hypertension: Secondary | ICD-10-CM | POA: Diagnosis not present

## 2020-11-23 LAB — URINALYSIS, ROUTINE W REFLEX MICROSCOPIC
Bilirubin Urine: NEGATIVE
Glucose, UA: NEGATIVE mg/dL
Hgb urine dipstick: NEGATIVE
Ketones, ur: 5 mg/dL — AB
Leukocytes,Ua: NEGATIVE
Nitrite: NEGATIVE
Protein, ur: NEGATIVE mg/dL
Specific Gravity, Urine: 1.019 (ref 1.005–1.030)
pH: 6 (ref 5.0–8.0)

## 2020-11-23 LAB — CBC WITH DIFFERENTIAL/PLATELET
Abs Immature Granulocytes: 0.24 10*3/uL — ABNORMAL HIGH (ref 0.00–0.07)
Basophils Absolute: 0 10*3/uL (ref 0.0–0.1)
Basophils Relative: 0 %
Eosinophils Absolute: 0 10*3/uL (ref 0.0–0.5)
Eosinophils Relative: 0 %
HCT: 41.9 % (ref 39.0–52.0)
Hemoglobin: 12.8 g/dL — ABNORMAL LOW (ref 13.0–17.0)
Immature Granulocytes: 1 %
Lymphocytes Relative: 1 %
Lymphs Abs: 0.4 10*3/uL — ABNORMAL LOW (ref 0.7–4.0)
MCH: 28 pg (ref 26.0–34.0)
MCHC: 30.5 g/dL (ref 30.0–36.0)
MCV: 91.7 fL (ref 80.0–100.0)
Monocytes Absolute: 1.1 10*3/uL — ABNORMAL HIGH (ref 0.1–1.0)
Monocytes Relative: 4 %
Neutro Abs: 24.8 10*3/uL — ABNORMAL HIGH (ref 1.7–7.7)
Neutrophils Relative %: 94 %
Platelets: 198 10*3/uL (ref 150–400)
RBC: 4.57 MIL/uL (ref 4.22–5.81)
RDW: 17 % — ABNORMAL HIGH (ref 11.5–15.5)
WBC: 26.5 10*3/uL — ABNORMAL HIGH (ref 4.0–10.5)
nRBC: 0 % (ref 0.0–0.2)

## 2020-11-23 LAB — RESP PANEL BY RT-PCR (FLU A&B, COVID) ARPGX2
Influenza A by PCR: NEGATIVE
Influenza B by PCR: NEGATIVE
SARS Coronavirus 2 by RT PCR: NEGATIVE

## 2020-11-23 LAB — APTT: aPTT: 33 seconds (ref 24–36)

## 2020-11-23 LAB — LACTIC ACID, PLASMA
Lactic Acid, Venous: 1.8 mmol/L (ref 0.5–1.9)
Lactic Acid, Venous: 2.4 mmol/L (ref 0.5–1.9)
Lactic Acid, Venous: 0.9 mmol/L (ref 0.5–1.9)

## 2020-11-23 LAB — COMPREHENSIVE METABOLIC PANEL
ALT: 11 U/L (ref 0–44)
AST: 20 U/L (ref 15–41)
Albumin: 3.3 g/dL — ABNORMAL LOW (ref 3.5–5.0)
Alkaline Phosphatase: 69 U/L (ref 38–126)
Anion gap: 10 (ref 5–15)
BUN: 18 mg/dL (ref 8–23)
CO2: 23 mmol/L (ref 22–32)
Calcium: 9.9 mg/dL (ref 8.9–10.3)
Chloride: 103 mmol/L (ref 98–111)
Creatinine, Ser: 0.92 mg/dL (ref 0.61–1.24)
GFR, Estimated: >60 mL/min (ref >60)
Glucose, Bld: 133 mg/dL — ABNORMAL HIGH (ref 70–99)
Potassium: 3.3 mmol/L — ABNORMAL LOW (ref 3.5–5.1)
Sodium: 136 mmol/L (ref 135–145)
Total Bilirubin: 1 mg/dL (ref 0.3–1.2)
Total Protein: 6.2 g/dL — ABNORMAL LOW (ref 6.5–8.1)

## 2020-11-23 LAB — PROTIME-INR
INR: 1.3 — ABNORMAL HIGH (ref 0.8–1.2)
Prothrombin Time: 15.3 seconds — ABNORMAL HIGH (ref 11.4–15.2)

## 2020-11-23 MED ORDER — VANCOMYCIN HCL IN DEXTROSE 1-5 GM/200ML-% IV SOLN
1000.0000 mg | Freq: Once | INTRAVENOUS | Status: AC
  Administered 2020-11-23: 1000 mg via INTRAVENOUS
  Filled 2020-11-23: qty 200

## 2020-11-23 MED ORDER — SODIUM CHLORIDE 0.9 % IV SOLN
2.0000 g | Freq: Once | INTRAVENOUS | Status: AC
  Administered 2020-11-23: 2 g via INTRAVENOUS
  Filled 2020-11-23: qty 2

## 2020-11-23 MED ORDER — SODIUM CHLORIDE 0.9 % IV SOLN: 2.0000 g | Freq: Three times a day (TID) | INTRAVENOUS | Status: DC

## 2020-11-23 MED ORDER — LACTATED RINGERS IV BOLUS (SEPSIS)
250.0000 mL | Freq: Once | INTRAVENOUS | Status: AC
  Administered 2020-11-23: 250 mL via INTRAVENOUS

## 2020-11-23 MED ORDER — LACTATED RINGERS IV BOLUS (SEPSIS)
1000.0000 mL | Freq: Once | INTRAVENOUS | Status: AC
  Administered 2020-11-23: 1000 mL via INTRAVENOUS

## 2020-11-23 MED ORDER — VANCOMYCIN HCL IN DEXTROSE 1-5 GM/200ML-% IV SOLN: 1000.0000 mg | Freq: Two times a day (BID) | INTRAVENOUS | Status: DC

## 2020-11-23 MED ORDER — METRONIDAZOLE IN NACL 5-0.79 MG/ML-% IV SOLN
500.0000 mg | Freq: Once | INTRAVENOUS | Status: DC
  Filled 2020-11-23: qty 100

## 2020-11-23 NOTE — ED Notes (Signed)

## 2020-11-23 NOTE — ED Notes (Signed)
Lab at bedside

## 2020-11-23 NOTE — Discharge Instructions (Addendum)
Your exam, lab tests and chest xray are stable today with no source of todays fever found.  Return here for a recheck if you develop any new or worsening symptoms.  Be aware that you have blood cultures drawn here which should result within 2 days - you will be notified if there are any abnormal blood cultures.  Your white blood cell count is elevated tonight, suggesting an infection.  It is currently 26.5.  Have your MD recheck this lab early next week.

## 2020-11-23 NOTE — ED Triage Notes (Signed)
Pt brought in by daughter for fever and weakness that started this morning

## 2020-11-23 NOTE — ED Notes (Signed)
Pt rolled and changed into a clean sheet, gown and chuck pad. Pt placed on external male purewick at this time. Provided with warm blankets and repositioned.

## 2020-11-23 NOTE — ED Provider Notes (Signed)
[facility] EMERGENCY DEPARTMENT Provider Note   CSN: [id number] Arrival date & time: 11/23/20  1234     History Chief Complaint  Patient presents with  . Fever    Luke Quinn is a 87 y.o. male with history as outlined below, most significant for COPD, hypertension, history of prostate cancer, known AAA, and stages of Parkinson's disease who is mostly wheelchair and bedbound presenting with his wife at the bedside secondary to fever and weakness.  Patient woke this morning with a fever of 101.1 checked orally.  He has generalized increased weakness and sleepiness but no other complaints.  He was diagnosed with COVID-19 on February 8 and seemed to recover well from that illness.  Wife tested him with the home Covid test this morning which was negative.  She also had urinary test strips and states he had a negative urine dip at home prior to arrival.  He denies any specific symptoms, no nausea or vomiting, denies abdominal pain, chest pain or shortness of breath, no dysuria, no diarrhea.  He also denies headache, sore throat.  He does endorse poor appetite since yesterday and wife states he has had no p.o. intake today.  He did receive Tylenol earlier this morning for fever reduction.  HPI     Past Medical History:  Diagnosis Date  . AAA (abdominal aortic aneurysm) ([place])   . Cancer [facility])    prostate  . COPD (chronic obstructive pulmonary disease) ([place])   . Hypertension   . Macular degeneration, age related   . Parkinson's disease ([place])   . Shingles     Patient Active Problem List   Diagnosis Date Noted  . Parkinson's disease ([place]) 06/05/2020  . Malignant neoplasm of right lung ([place]) 07/01/2018  . Non-small cell lung cancer, right ([place]) 05/13/2018  . Abdominal aortic aneurysm (AAA) without rupture ([place]) 09/20/2015  . Chronic obstructive pulmonary disease ([place]) 09/20/2015  . Coronary artery disease involving native heart without angina pectoris 09/20/2015  . Essential  hypertension 09/20/2015    Past Surgical History:  Procedure Laterality Date  . cardiac stents    . PROSTATE SURGERY         Family History  Problem Relation Age of Onset  . Diabetes Mother   . Cancer Mother   . Hypertension Mother   . Diabetes Father     Social History   Tobacco Use  . Smoking status: Former Smoker    Quit date: 06/21/1995    Years since quitting: 25.4  . Smokeless tobacco: Never Used  Substance Use Topics  . Alcohol use: No    Alcohol/week: 0.0 standard drinks  . Drug use: No    Home Medications Prior to Admission medications   Medication Sig Start Date End Date Taking? Authorizing Provider  acetaminophen (TYLENOL) 500 MG tablet Take 500 mg by mouth every 6 (six) hours as needed.   Yes [provider]  apixaban (ELIQUIS) 5 MG TABS tablet Take 1 tablet (5 mg total) by mouth 2 (two) times daily. 10/23/20  Yes Daleen Bo, MD  carbidopa-levodopa (SINEMET IR) 25-100 MG tablet Take 2 tablets by mouth 4 (four) times daily.   Yes [provider]  Carboxymethylcellulose Sodium 0.25 % SOLN Apply 2 drops to eye QID.   Yes [provider]  diclofenac (VOLTAREN) 50 MG EC tablet Take 50 mg by mouth 3 (three) times daily.   Yes [provider]  ferrous sulfate 325 (65 FE) MG tablet Take 325 mg by mouth daily  with breakfast.   Yes [provider]  GLUCOSAMINE CHONDROITIN MSM PO Take 500 mg by mouth in the morning and at bedtime.   Yes [provider]  hydrALAZINE (APRESOLINE) 100 MG tablet Take 100 mg by mouth 2 (two) times daily.   Yes [provider]  hydrochlorothiazide (HYDRODIURIL) 25 MG tablet Take 25 mg by mouth daily.   Yes [provider]  lidocaine (LIDODERM) 5 % Place 1 patch onto the skin daily.   Yes [provider]  losartan (COZAAR) 100 MG tablet Take 100 mg by mouth daily.   Yes [provider]  Multiple Vitamins-Minerals (ICAPS AREDS 2 PO) Take by mouth 2 (two)  times daily.   Yes [provider]  omeprazole (PRILOSEC) 20 MG capsule Take 20 mg by mouth 2 (two) times daily before a meal.   Yes [provider]  POTASSIUM CHLORIDE PO Take 20 mEq by mouth 2 (two) times daily.   Yes [provider]  rasagiline (AZILECT) 1 MG TABS tablet Take 1 tablet by mouth daily.   Yes [provider]  senna-docusate (SENOKOT-S) 8.6-50 MG tablet Take 2 tablets by mouth at bedtime.   Yes [provider]  ALBUTEROL IN Inhale 90 mcg into the lungs 4 (four) times daily as needed. Patient not taking: No sig reported    [provider]  atorvastatin (LIPITOR) 40 MG tablet Take 40 mg by mouth daily. Patient not taking: No sig reported    [provider]  lidocaine (LMX) 4 % cream Apply 1 application topically 3 (three) times daily as needed. Apply small amount to affected area three times a day as needed for back pain  Per medication list from [place] Patient not taking: Reported on 10/23/2020    [provider]  metoprolol tartrate (LOPRESSOR) 25 MG tablet Take 12.5 mg by mouth 2 (two) times daily. Patient not taking: No sig reported    [provider]  Molnupiravir 200 MG CAPS Take 4 capsules by mouth every 12 (twelve) hours. Patient not taking: Reported on 11/23/2020 10/20/20   [provider]  omega-3 acid ethyl esters (LOVAZA) 1 g capsule Take 1 g by mouth daily. Patient not taking: Reported on 11/23/2020    [provider]  rasagiline (AZILECT) 0.5 MG TABS tablet Take 1 mg by mouth daily. Patient not taking: Reported on 11/23/2020    [provider]    Allergies    Gabapentin  Review of Systems   Review of Systems  Constitutional: Positive for appetite change, fatigue and fever.  HENT: Negative for congestion and sore throat.   Eyes: Negative.   Respiratory: Negative for chest tightness and shortness of breath.   Cardiovascular: Negative for chest pain.   Gastrointestinal: Negative for abdominal pain, diarrhea, nausea and vomiting.  Genitourinary: Negative.  Negative for dysuria.  Musculoskeletal: Negative for arthralgias, joint swelling and neck pain.  Skin: Negative.  Negative for rash and wound.  Neurological: Positive for weakness. Negative for dizziness, light-headedness, numbness and headaches.  Psychiatric/Behavioral: Negative.   All other systems reviewed and are negative.   Physical Exam Updated Vital Signs BP 133/75   Pulse 67   Temp 98 F (36.7 C) (Oral)   Resp 17   Ht _0  (1.905 m)   Wt 73.5 kg   SpO2 96%   BMI 20.25 kg/m   Physical Exam Vitals and nursing note reviewed.  Constitutional:      Appearance: He is well-developed.  Comments: Patient appears fatigued, presenting with eyes closed but does respond to questions.  HENT:     Head: Normocephalic and atraumatic.     Comments: Dry buccal mucosa.     Mouth/Throat:     Mouth: Mucous membranes are dry.  Eyes:     Conjunctiva/sclera: Conjunctivae normal.  Cardiovascular:     Rate and Rhythm: Normal rate and regular rhythm.     Heart sounds: Normal heart sounds.  Pulmonary:     Effort: Pulmonary effort is normal.     Breath sounds: Normal breath sounds. No wheezing or rhonchi.  Abdominal:     General: Bowel sounds are normal.     Palpations: Abdomen is soft.     Tenderness: There is no abdominal tenderness. There is no guarding or rebound.  Musculoskeletal:        General: Normal range of motion.     Cervical back: Normal range of motion.  Skin:    General: Skin is warm and dry.     Findings: No erythema or rash.     ED Results / Procedures / Treatments   Labs (all labs ordered are listed, but only abnormal results are displayed) Labs Reviewed  LACTIC ACID, PLASMA - Abnormal; Notable for the following components:      Result Value   Lactic Acid, Venous 2.4 (*)    All other components within normal limits  CBC WITH DIFFERENTIAL/PLATELET -  Abnormal; Notable for the following components:   WBC 26.5 (*)    Hemoglobin 12.8 (*)    RDW 17.0 (*)    Neutro Abs 24.8 (*)    Lymphs Abs 0.4 (*)    Monocytes Absolute 1.1 (*)    Abs Immature Granulocytes 0.24 (*)    All other components within normal limits  URINALYSIS, ROUTINE W REFLEX MICROSCOPIC - Abnormal; Notable for the following components:   Ketones, ur 5 (*)    All other components within normal limits  PROTIME-INR - Abnormal; Notable for the following components:   Prothrombin Time 15.3 (*)    INR 1.3 (*)    All other components within normal limits  COMPREHENSIVE METABOLIC PANEL - Abnormal; Notable for the following components:   Potassium 3.3 (*)    Glucose, Bld 133 (*)    Total Protein 6.2 (*)    Albumin 3.3 (*)    All other components within normal limits  RESP PANEL BY RT-PCR (FLU A&B, COVID) ARPGX2  CULTURE, BLOOD (ROUTINE X 2)  CULTURE, BLOOD (ROUTINE X 2)  URINE CULTURE  LACTIC ACID, PLASMA  APTT  LACTIC ACID, PLASMA    EKG EKG Interpretation  Date/Time:  Friday November 23 2020 12:38:32 EST Ventricular Rate:  101 PR Interval:    QRS Duration: 90 QT Interval:  346 QTC Calculation: 449 R Axis:   -53 Text Interpretation: Atrial fibrillation Left anterior fascicular block Anterior infarct, old No significant change since last tracing Confirmed by Isla Pence [phone]) on 11/23/2020 5:40:35 PM   Radiology DG Chest Port 1 View  Result Date: 11/23/2020 CLINICAL DATA:  Questionable sepsis. Weakness. History of hypertension and COPD. EXAM: PORTABLE CHEST 1 VIEW COMPARISON:  Chest radiographs and CTA 10/23/2020 FINDINGS: The cardiac silhouette remains enlarged. Aortic atherosclerosis is noted. Band like opacity in the right upper lobe is unchanged and may reflect scarring. There is chronic peribronchial thickening with similar interstitial prominence greatest in the lung bases. No acute airspace consolidation, overt pulmonary edema, pleural effusion,  pneumothorax is identified. No acute osseous abnormality is  seen. IMPRESSION: Chronic lung changes without evidence of acute cardiopulmonary process. Electronically Signed   By: Logan Bores M.D.   On: 11/23/2020 15:14    Procedures Procedures   Medications Ordered in ED Medications  ceFEPIme (MAXIPIME) 2 g in sodium chloride 0.9 % 100 mL IVPB (2 g Intravenous Not Given 11/23/20 2249)  vancomycin (VANCOCIN) IVPB 1000 mg/200 mL premix (0 mg Intravenous Hold 11/24/20 0200)  lactated ringers bolus 1,000 mL (0 mLs Intravenous Stopped 11/23/20 1710)    And  lactated ringers bolus 1,000 mL (0 mLs Intravenous Stopped 11/23/20 1752)    And  lactated ringers bolus 250 mL (0 mLs Intravenous Stopped 11/23/20 1904)  vancomycin (VANCOCIN) IVPB 1000 mg/200 mL premix (0 mg Intravenous Stopped 11/23/20 1710)  ceFEPIme (MAXIPIME) 2 g in sodium chloride 0.9 % 100 mL IVPB (0 g Intravenous Stopped 11/23/20 1559)    ED Course  I have reviewed the triage vital signs and the nursing notes.  Pertinent labs & imaging results that were available during my care of the patient were reviewed by me and considered in my medical decision making (see chart for details).    MDM Rules/Calculators/A&P                          Pt with complaint of fever, initially presenting with tachypnea and generalized weakness.  Sepsis protocol initiated including IV fluids and undifferentiated abx given.  Labs including blood cultures x2, c-Met, respiratory panel urinalysis all without significant findings or source of infection identified.  He did have a significant elevation in his WBC count of 26.5 of unclear etiology.  His initial lactic acid was normal range, his second lactic acid was elevated at 2.4.  However he was feeling much improved after receiving IV fluids and had no complaints, remained afebrile here.  A third lactate was obtained and was normal range at 0.9.  There is no indication for admission today, however he and daughter  were advised that his blood cultures are pending at this time and they will be notified if there are any abnormalities on these tests.  He was advised close follow-up with his primary doctor for recheck of his WBC count early next week.  He and daughter agree with this plan.  Was also advised to return here over the weekend if he develops return of fever or any new symptoms or complaint.     Final Clinical Impression(s) / ED Diagnoses Final diagnoses:  Fever, unspecified fever cause  Leukocytosis, unspecified type    Rx / DC Orders ED Discharge Orders    None       Landis Martins 11/23/20 2318    Isla Pence, MD 11/24/20 1520

## 2020-11-23 NOTE — Progress Notes (Signed)
Elink following Code Sepsis. 

## 2020-11-23 NOTE — ED Notes (Signed)
Date and time results received: 11/23/20 5:35 PM  Test: lactic acid Critical Value: 2.4  Name of Provider Notified: julie idol,pa  Orders Received? Or Actions Taken?: n/a

## 2020-11-23 NOTE — Progress Notes (Signed)
Pharmacy Antibiotic Note  Luke Quinn is a 87 y.o. male admitted on 11/23/2020 with sepsis.  Pharmacy has been consulted for Cefepime and Vancomycin dosing.  Plan: Cefepime 2gm IV q8hrs Vancomycin 1000 mg IV Q 12 hrs. Goal AUC 400-550. Expected AUC: 532.9 SCr used: 0.82, Css min predicted 15.1  Height: 6\' 3"  (190.5 cm) Weight: 73.5 kg (162 lb 0.6 oz) IBW/kg (Calculated) : 84.5  Temp (24hrs), Avg:98.5 F (36.9 C), Min:97.9 F (36.6 C), Max:99.1 F (37.3 C)  Recent Labs  Lab 11/23/20 1420 11/23/20 1505  WBC 26.5*  --   LATICACIDVEN  --  1.8    CrCl cannot be calculated (Patient's most recent lab result is older than the maximum 21 days allowed.).    Allergies  Allergen Reactions  . Gabapentin     Pt is unable to maintain his balance while taking this med     Antimicrobials this admission:   >>    >>   Dose adjustments this admission:   Microbiology results:  BCx:   UCx:    Sputum:    MRSA PCR:   Thank you for allowing pharmacy to be a part of this patient's care.  Hart Robinsons A 11/23/2020 4:38 PM

## 2020-11-25 LAB — URINE CULTURE: Culture: NO GROWTH

## 2020-11-28 LAB — CULTURE, BLOOD (ROUTINE X 2)
Culture: NO GROWTH
Culture: NO GROWTH
Special Requests: ADEQUATE
Special Requests: ADEQUATE

## 2021-01-24 ENCOUNTER — Encounter: Payer: Self-pay | Admitting: Internal Medicine

## 2021-06-06 ENCOUNTER — Encounter: Payer: Self-pay | Admitting: Gastroenterology

## 2021-06-06 NOTE — Progress Notes (Deleted)
Referring Provider: [facility] Primary Care Physician:  [facility] Primary Gastroenterologist:  Dr. Abbey Chatters  No chief complaint on file.   HPI:   Luke Quinn is a 87 y.o. male presenting today at the request of [facility] for anemia.  Reviewed referral information: - Referral for IDA.  States starting iron and d'cing NSAIDs. - Patient had follow-up office visit with [facility] in March.  He had tested positive for COVID in February.  He returned to the ER on 2/8 and was found to have right lower lobe segmental PE and atrial fibrillation on EKG.  He was started on Eliquis.  The EKG few weeks later in the clinic still with persistent atrial fibrillation, continued on Eliquis. Doesn't appear IDA was discussed at that visit.  - Labs included with referral with hemoglobin 11.5 on 11/29/2020.  Creatinine 0.798.  Now iron panel included.   Today:   Past Medical History:  Diagnosis Date   AAA (abdominal aortic aneurysm) ([place])    status post EVAR November 2016.   Atrial fibrillation [facility]) 10/2020   Feb 2022, started on Eliquis   CAD (coronary artery disease)    multiple cardiac stents placed years ago.   Cancer [facility])    prostate   COPD (chronic obstructive pulmonary disease) ([facility])    Depression    Hypertension    Lung cancer ([place])    right;  s/p SBRT treatment completed in November 2017   Macular degeneration, age related    Ocular hypertension    Parkinson's disease ([place])    Pulmonary embolism ([place])    Feb 2022, started on Eliquis   PVD (peripheral vascular disease) ([place])    Shingles     Past Surgical History:  Procedure Laterality Date   cardiac stents     PROSTATE SURGERY      Current Outpatient Medications  Medication Sig Dispense Refill   acetaminophen (TYLENOL) 500 MG tablet Take 500 mg by mouth every 6 (six) hours as needed.     ALBUTEROL IN Inhale 90 mcg into the lungs 4 (four) times daily as needed. (Patient not taking: No sig  reported)     apixaban (ELIQUIS) 5 MG TABS tablet Take 1 tablet (5 mg total) by mouth 2 (two) times daily. 60 tablet 0   atorvastatin (LIPITOR) 40 MG tablet Take 40 mg by mouth daily. (Patient not taking: No sig reported)     carbidopa-levodopa (SINEMET IR) 25-100 MG tablet Take 2 tablets by mouth 4 (four) times daily.     Carboxymethylcellulose Sodium 0.25 % SOLN Apply 2 drops to eye QID.     diclofenac (VOLTAREN) 50 MG EC tablet Take 50 mg by mouth 3 (three) times daily.     ferrous sulfate 325 (65 FE) MG tablet Take 325 mg by mouth daily with breakfast.     GLUCOSAMINE CHONDROITIN MSM PO Take 500 mg by mouth in the morning and at bedtime.     hydrALAZINE (APRESOLINE) 100 MG tablet Take 100 mg by mouth 2 (two) times daily.     hydrochlorothiazide (HYDRODIURIL) 25 MG tablet Take 25 mg by mouth daily.     lidocaine (LIDODERM) 5 % Place 1 patch onto the skin daily.     lidocaine (LMX) 4 % cream Apply 1 application topically 3 (three) times daily as needed. Apply small amount to affected area three times a day as needed for back pain  Per medication list from [place] (Patient not taking: Reported on  10/23/2020)     losartan (COZAAR) 100 MG tablet Take 100 mg by mouth daily.     metoprolol tartrate (LOPRESSOR) 25 MG tablet Take 12.5 mg by mouth 2 (two) times daily. (Patient not taking: No sig reported)     Molnupiravir 200 MG CAPS Take 4 capsules by mouth every 12 (twelve) hours. (Patient not taking: Reported on 11/23/2020)     Multiple Vitamins-Minerals (ICAPS AREDS 2 PO) Take by mouth 2 (two) times daily.     omega-3 acid ethyl esters (LOVAZA) 1 g capsule Take 1 g by mouth daily. (Patient not taking: Reported on 11/23/2020)     omeprazole (PRILOSEC) 20 MG capsule Take 20 mg by mouth 2 (two) times daily before a meal.     POTASSIUM CHLORIDE PO Take 20 mEq by mouth 2 (two) times daily.     rasagiline (AZILECT) 0.5 MG TABS tablet Take 1 mg by mouth daily. (Patient not taking: Reported on 11/23/2020)      rasagiline (AZILECT) 1 MG TABS tablet Take 1 tablet by mouth daily.     senna-docusate (SENOKOT-S) 8.6-50 MG tablet Take 2 tablets by mouth at bedtime.     No current facility-administered medications for this visit.    Allergies as of 06/07/2021 - Review Complete 11/23/2020  Allergen Reaction Noted   Gabapentin  05/03/2020    Family History  Problem Relation Age of Onset   Diabetes Mother    Cancer Mother    Hypertension Mother    Diabetes Father     Social History   Socioeconomic History   Marital status: Married    Spouse name: Not on file   Number of children: Not on file   Years of education: Not on file   Highest education level: Not on file  Occupational History   Not on file  Tobacco Use   Smoking status: Former    Types: Cigarettes    Quit date: 06/21/1995    Years since quitting: 25.9   Smokeless tobacco: Never  Substance and Sexual Activity   Alcohol use: No    Alcohol/week: 0.0 standard drinks   Drug use: No   Sexual activity: Not on file  Other Topics Concern   Not on file  Social History Narrative   Not on file   Social Determinants of Health   Financial Resource Strain: Not on file  Food Insecurity: Not on file  Transportation Needs: Not on file  Physical Activity: Not on file  Stress: Not on file  Social Connections: Not on file  Intimate Partner Violence: Not on file    Review of Systems: Gen: Denies any fever, chills, fatigue, weight loss, lack of appetite.  CV: Denies chest pain, heart palpitations, peripheral edema, syncope.  Resp: Denies shortness of breath at rest or with exertion. Denies wheezing or cough.  GI: Denies dysphagia or odynophagia. Denies jaundice, hematemesis, fecal incontinence. GU : Denies urinary burning, urinary frequency, urinary hesitancy MS: Denies joint pain, muscle weakness, cramps, or limitation of movement.  Derm: Denies rash, itching, dry skin Psych: Denies depression, anxiety, memory loss, and  confusion Heme: Denies bruising, bleeding, and enlarged lymph nodes.  Physical Exam: There were no vitals taken for this visit. General:   Alert and oriented. Pleasant and cooperative. Well-nourished and well-developed.  Head:  Normocephalic and atraumatic. Eyes:  Without icterus, sclera clear and conjunctiva pink.  Ears:  Normal auditory acuity. Nose:  No deformity, discharge,  or lesions. Mouth:  No deformity or lesions, oral mucosa pink.  Neck:  Supple, without mass or thyromegaly. Lungs:  Clear to auscultation bilaterally. No wheezes, rales, or rhonchi. No distress.  Heart:  S1, S2 present without murmurs appreciated.  Abdomen:  +BS, soft, non-tender and non-distended. No HSM noted. No guarding or rebound. No masses appreciated.  Rectal:  Deferred  Msk:  Symmetrical without gross deformities. Normal posture. Pulses:  Normal pulses noted. Extremities:  Without clubbing or edema. Neurologic:  Alert and  oriented x4;  grossly normal neurologically. Skin:  Intact without significant lesions or rashes. Cervical Nodes:  No significant cervical adenopathy. Psych:  Alert and cooperative. Normal mood and affect.

## 2021-06-07 ENCOUNTER — Ambulatory Visit: Payer: No Typology Code available for payment source | Admitting: Gastroenterology

## 2021-06-07 ENCOUNTER — Encounter: Payer: Self-pay | Admitting: Internal Medicine

## 2021-06-07 ENCOUNTER — Encounter: Payer: Self-pay | Admitting: Gastroenterology

## 2021-11-05 ENCOUNTER — Other Ambulatory Visit: Payer: Self-pay

## 2021-11-05 ENCOUNTER — Encounter: Payer: Self-pay | Admitting: Urology

## 2021-11-05 ENCOUNTER — Ambulatory Visit: Payer: No Typology Code available for payment source | Admitting: Urology

## 2021-11-05 ENCOUNTER — Ambulatory Visit (INDEPENDENT_AMBULATORY_CARE_PROVIDER_SITE_OTHER): Payer: No Typology Code available for payment source | Admitting: Urology

## 2021-11-05 VITALS — BP 114/67 | HR 89 | Wt 162.0 lb

## 2021-11-05 DIAGNOSIS — N390 Urinary tract infection, site not specified: Secondary | ICD-10-CM | POA: Insufficient documentation

## 2021-11-05 DIAGNOSIS — Z8546 Personal history of malignant neoplasm of prostate: Secondary | ICD-10-CM | POA: Diagnosis not present

## 2021-11-05 DIAGNOSIS — Z978 Presence of other specified devices: Secondary | ICD-10-CM | POA: Diagnosis not present

## 2021-11-05 DIAGNOSIS — R339 Retention of urine, unspecified: Secondary | ICD-10-CM | POA: Diagnosis not present

## 2021-11-05 LAB — MICROSCOPIC EXAMINATION
Epithelial Cells (non renal): NONE SEEN /hpf (ref 0–10)
RBC, Urine: NONE SEEN /hpf (ref 0–2)
Renal Epithel, UA: NONE SEEN /hpf
WBC, UA: >30 /hpf — AB (ref 0–5)

## 2021-11-05 LAB — URINALYSIS, ROUTINE W REFLEX MICROSCOPIC
Bilirubin, UA: NEGATIVE
Glucose, UA: NEGATIVE
Ketones, UA: NEGATIVE
Nitrite, UA: POSITIVE — AB
RBC, UA: NEGATIVE
Specific Gravity, UA: 1.025 (ref 1.005–1.030)
Urobilinogen, Ur: 0.2 mg/dL (ref 0.2–1.0)
pH, UA: 5.5 (ref 5.0–7.5)

## 2021-11-05 NOTE — Addendum Note (Signed)
Addended byIris Pert on: 11/05/2021 02:29 PM   Modules accepted: Orders

## 2021-11-05 NOTE — Progress Notes (Signed)
Assessment: 1. Frequent UTI   2. Incomplete bladder emptying   3. History of prostate cancer   4. Chronic indwelling Foley catheter     Plan: I reviewed available records from the [place].  I do not see any recent imaging studies or evidence of cystoscopy.  No recent culture results were available for review. I discussed suprapubic tube placement with Luke Quinn and his wife.  I recommended further evaluation with CT imaging and cystoscopy given his prior lower abdominal surgery and recurrent UTIs.  I also explained that even with a suprapubic tube, he would continue to have an increased risk of UTIs.  Given his multiple medical problems, he is at significant risk for any surgical procedure.  It may be reasonable to consider placement of a suprapubic tube by interventional radiology in order to limit the need for anesthesia. Urine culture today Schedule for CT abd/pel w/o contrast Return for cystoscopy after CT done  I personally spent 50 minutes involved in face to face and non-face-to-face activities for this patient on the day of the visit.  Professional time spent included the following activities, in addition to those noted in the documentation: Review of [place] records, discussion of options for management of his incomplete bladder emptying and recurrent UTIs, discussion of options for suprapubic tube placement and recommendations for further evaluation with CT imaging and cystoscopy.   Chief Complaint:  Chief Complaint  Patient presents with   Urinary Retention    History of Present Illness:  Luke Quinn is a 88 y.o. year old male who is seen in consultation from [place] for evaluation of urinary retention and recurrent UTIs.   He is status post radical retropubic prostatectomy in 2002 and had been followed by the [facility] in [place] for prostate cancer.  PSA from 1/22 was <0.010.  He has a history of urinary incontinence, previously requiring incontinence pads on a  regular basis.  Cystoscopy from January 2017 showed a frozen bladder neck without evidence of stricture.  More recently, he has had problems with incomplete bladder emptying and recurrent UTIs.  He has been seen by urology at the [facility] clinic.  Bladder scan from June 2022 showed 262 mL in the bladder. Renal ultrasound from May 2022 showed no hydronephrosis, right renal cyst, nondistended bladder.  He has been treated for recurrent UTIs.  A Foley catheter was placed in August 2022.  This has been changed monthly by home health care.  He has continued to have frequent UTIs requiring antibiotic therapy.  No recent culture results available.  He recently completed a course of amoxicillin and Levaquin for a UTI.  His catheter was last changed 1 week ago.  He presents today for consultation regarding suprapubic tube placement.  He has late stage Parkinson's disease, history of lung cancer, and is on chronic anticoagulation for atrial fibrillation.   Past Medical History:  Past Medical History:  Diagnosis Date   AAA (abdominal aortic aneurysm)    status post EVAR November 2016.   Atrial fibrillation [facility]) 10/2020   Feb 2022, started on Eliquis   CAD (coronary artery disease)    multiple cardiac stents placed years ago.   Cancer [facility])    prostate   COPD (chronic obstructive pulmonary disease) (HCC)    Depression    Hypertension    Lung cancer ([place])    right;  s/p SBRT treatment completed in November 2017   Macular degeneration, age related    Ocular hypertension  Parkinson's disease [facility])    Pulmonary embolism [facility])    Feb 2022, started on Eliquis   PVD (peripheral vascular disease) ([place])    Shingles     Past Surgical History:  Past Surgical History:  Procedure Laterality Date   cardiac stents     PROSTATE SURGERY      Allergies:  Allergies  Allergen Reactions   Gabapentin     Pt is unable to maintain his balance while taking this med     Family History:  Family  History  Problem Relation Age of Onset   Diabetes Mother    Cancer Mother    Hypertension Mother    Diabetes Father     Social History:  Social History   Tobacco Use   Smoking status: Former    Types: Cigarettes    Quit date: 06/21/1995    Years since quitting: 26.3   Smokeless tobacco: Never  Substance Use Topics   Alcohol use: No    Alcohol/week: 0.0 standard drinks   Drug use: No    Review of symptoms:  Constitutional:  Negative for unexplained weight loss, night sweats, fever, chills ENT:  Negative for nose bleeds, sinus pain, painful swallowing CV:  Negative for chest pain, shortness of breath, exercise intolerance, palpitations, loss of consciousness Resp:  Negative for cough, wheezing, shortness of breath GI:  Negative for nausea, vomiting, diarrhea, bloody stools GU:  Positives noted in HPI; otherwise negative for gross hematuria, dysuria, urinary incontinence Neuro:  Negative for seizures, poor balance, limb weakness, slurred speech Psych:  Negative for lack of energy, depression, anxiety Endocrine:  Negative for polydipsia, polyuria, symptoms of hypoglycemia (dizziness, hunger, sweating) Hematologic:  Negative for anemia, purpura, petechia, prolonged or excessive bleeding, use of anticoagulants  Allergic:  Negative for difficulty breathing or choking as a result of exposure to anything; no shellfish allergy; no allergic response (rash/itch) to materials, foods  Physical exam: BP 114/67    Pulse 89    Wt 162 lb (73.5 kg)    BMI 20.25 kg/m  GENERAL APPEARANCE: Chronically ill appearing, elderly male on stretcher, NAD HEENT: Atraumatic, Normocephalic, oropharynx clear. NECK: Supple without lymphadenopathy or thyromegaly. LUNGS: Clear to auscultation bilaterally. HEART: Regular Rate and Rhythm without murmurs, gallops, or rubs. ABDOMEN: Soft, non-tender, No Masses. EXTREMITIES: Moves all extremities well.  Without clubbing, cyanosis, or edema. NEUROLOGIC:  Alert and  oriented x 3, CN II-XII grossly intact.  MENTAL STATUS:  Appropriate. BACK:  Non-tender to palpation.  No CVAT SKIN:  Warm, dry and intact. GU:  foley in place draining yellow urine   Results: U/A: >30 WBCs, many bacteria, nitrite positive

## 2021-11-08 ENCOUNTER — Telehealth: Payer: Self-pay

## 2021-11-08 LAB — URINE CULTURE

## 2021-11-08 MED ORDER — CEFDINIR 300 MG PO CAPS: 300.0000 mg | ORAL_CAPSULE | Freq: Two times a day (BID) | ORAL | 0 refills | Status: AC

## 2021-11-08 NOTE — Telephone Encounter (Signed)
-----   Message from Primus Bravo, MD sent at 11/08/2021  9:17 AM EST ----- Please notify patient to begin Omnicef BID x 10 days on 11/23/21 for treatment of UTI and in preparation for his cystoscopy on 12/03/21.

## 2021-11-08 NOTE — Telephone Encounter (Signed)
Patient's wife called and made aware. Wife voiced understanding.

## 2021-11-08 NOTE — Addendum Note (Signed)
Addended by: Primus Bravo on: 11/08/2021 09:17 AM   Modules accepted: Orders

## 2021-11-11 ENCOUNTER — Telehealth: Payer: Self-pay

## 2021-11-11 NOTE — Telephone Encounter (Signed)
Detailed message left on wife cell phone to start [facility] antibiotic now and to call office back if any questions.

## 2021-11-11 NOTE — Telephone Encounter (Signed)
Wife called office today to report change in patient mental status- wife gave patient two bactrim antibiotics that she had at home.  Reviewed patient chart and medication to be started 10 days prior to cystoscopy. Wife reports she does not want to wait to give the medication.  Patient has change in appetite and mental status. Instructed wife if patient mental status has changed, he would need to go to ER for further evaluation. Wife states that she is not taking patient to the ER.  Informed wife I would send message to MD for recommendations-

## 2021-11-22 ENCOUNTER — Ambulatory Visit (HOSPITAL_COMMUNITY)
Admission: RE | Admit: 2021-11-22 | Discharge: 2021-11-22 | Disposition: A | Discharge status: Home / Self Care | Payer: No Typology Code available for payment source | Source: Ambulatory Visit | Attending: Urology | Admitting: Urology

## 2021-11-22 ENCOUNTER — Other Ambulatory Visit: Payer: Self-pay

## 2021-11-22 DIAGNOSIS — Z8546 Personal history of malignant neoplasm of prostate: Secondary | ICD-10-CM | POA: Diagnosis present

## 2021-11-22 DIAGNOSIS — N390 Urinary tract infection, site not specified: Secondary | ICD-10-CM

## 2021-12-02 ENCOUNTER — Telehealth: Payer: Self-pay | Admitting: General Practice

## 2021-12-02 NOTE — Telephone Encounter (Signed)
Spouse called in on patient behalf. ? ?States that patient has an UTI due to smell of urine, wants to know if provider needs/ wants to send antibiotic to start  before patient appt on 3/21 ?

## 2021-12-03 ENCOUNTER — Other Ambulatory Visit: Payer: No Typology Code available for payment source | Admitting: Urology

## 2021-12-03 NOTE — Progress Notes (Deleted)
? ?Assessment: ?1. Frequent UTI   ?2. History of prostate cancer   ?3. Chronic indwelling Foley catheter   ?4. Incomplete bladder emptying   ? ? ? ?Plan: ?I reviewed available records from the [place].  I do not see any recent imaging studies or evidence of cystoscopy.  No recent culture results were available for review. ?I discussed suprapubic tube placement with Luke Quinn and his wife.  I recommended further evaluation with CT imaging and cystoscopy given his prior lower abdominal surgery and recurrent UTIs.  I also explained that even with a suprapubic tube, he would continue to have an increased risk of UTIs.  Given his multiple medical problems, he is at significant risk for any surgical procedure.  It may be reasonable to consider placement of a suprapubic tube by interventional radiology in order to limit the need for anesthesia. ?Urine culture today ?Schedule for CT abd/pel w/o contrast ?Return for cystoscopy after CT done ? ? ?Chief Complaint:  ?No chief complaint on file. ? ? ?History of Present Illness: ? ?Luke Quinn is a 88 y.o. year old male who is seen for further evaluation of urinary retention and recurrent UTIs.   He is status post radical retropubic prostatectomy in 2002 and had been followed by the [facility] in [place] for prostate cancer.  PSA from 1/22 was <0.010.  He has a history of urinary incontinence, previously requiring incontinence pads on a regular basis.  Cystoscopy from January 2017 showed a frozen bladder neck without evidence of stricture.  More recently, he has had problems with incomplete bladder emptying and recurrent UTIs.  He has been seen by urology at the [facility] clinic.  Bladder scan from June 2022 showed 262 mL in the bladder. ?Renal ultrasound from May 2022 showed no hydronephrosis, right renal cyst, nondistended bladder.  He has been treated for recurrent UTIs.  A Foley catheter was placed in August 2022.  This has been changed monthly by home health  care.  He has continued to have frequent UTIs requiring antibiotic therapy.  No recent culture results available.  Prior to his visit in 2/23, he had completed a course of amoxicillin and Levaquin for a UTI.   ? ?He has late stage Parkinson's disease, history of lung cancer, and is on chronic anticoagulation for atrial fibrillation. ? ? ?Past Medical History:  ?Past Medical History:  ?Diagnosis Date  ? AAA (abdominal aortic aneurysm)   ? status post EVAR November 2016.  ? Atrial fibrillation ([place]) 10/2020  ? Feb 2022, started on Eliquis  ? CAD (coronary artery disease)   ? multiple cardiac stents placed years ago.  ? Cancer [facility])   ? prostate  ? COPD (chronic obstructive pulmonary disease) ([place])   ? Depression   ? Hypertension   ? Lung cancer ([place])   ? right;  s/p SBRT treatment completed in November 2017  ? Macular degeneration, age related   ? Ocular hypertension   ? Parkinson's disease ([place])   ? Pulmonary embolism ([place])   ? Feb 2022, started on Eliquis  ? PVD (peripheral vascular disease) ([place])   ? Shingles   ? ? ?Past Surgical History:  ?Past Surgical History:  ?Procedure Laterality Date  ? cardiac stents    ? PROSTATE SURGERY    ? ? ?Allergies:  ?Allergies  ?Allergen Reactions  ? Gabapentin   ?  Pt is unable to maintain his balance while taking this med ?  ? ? ?Family History:  ?Family History  ?  Problem Relation Age of Onset  ? Diabetes Mother   ? Cancer Mother   ? Hypertension Mother   ? Diabetes Father   ? ? ?Social History:  ?Social History  ? ?Tobacco Use  ? Smoking status: Former  ?  Types: Cigarettes  ?  Quit date: 06/21/1995  ?  Years since quitting: 26.4  ? Smokeless tobacco: Never  ?Substance Use Topics  ? Alcohol use: No  ?  Alcohol/week: 0.0 standard drinks  ? Drug use: No  ? ? ?ROS: ?Constitutional:  Negative for fever, chills, weight loss ?CV: Negative for chest pain, previous MI, hypertension ?Respiratory:  Negative for shortness of breath, wheezing, sleep apnea, frequent cough ?GI:  Negative for  nausea, vomiting, bloody stool, GERD ? ?Physical exam: ?There were no vitals taken for this visit. ?*** ? ?Results: ?U/A: ?

## 2021-12-04 ENCOUNTER — Other Ambulatory Visit: Payer: Self-pay

## 2021-12-04 ENCOUNTER — Other Ambulatory Visit: Payer: No Typology Code available for payment source

## 2021-12-04 ENCOUNTER — Telehealth: Payer: Self-pay

## 2021-12-04 DIAGNOSIS — N390 Urinary tract infection, site not specified: Secondary | ICD-10-CM

## 2021-12-04 NOTE — Addendum Note (Signed)
Addended byIris Pert on: 12/04/2021 05:46 PM ? ? Modules accepted: Orders ? ?

## 2021-12-04 NOTE — Telephone Encounter (Signed)
I called and spoke with patient wife. Wife reports she took ua sample directly from catheter port. ? ?Urine sent for culture.  ?

## 2021-12-04 NOTE — Telephone Encounter (Signed)
Patients daughter came in with urine cup and advised home health nurse stated patients urine was cloudy and he was having pain when urinating so she brought in  urine for analysis.  ?

## 2021-12-04 NOTE — Telephone Encounter (Signed)
FYI- home health collected urine for culture and brought by office for test.  ?

## 2021-12-05 ENCOUNTER — Other Ambulatory Visit: Payer: Self-pay | Admitting: Urology

## 2021-12-05 ENCOUNTER — Telehealth: Payer: Self-pay

## 2021-12-05 LAB — URINALYSIS, ROUTINE W REFLEX MICROSCOPIC
Bilirubin, UA: NEGATIVE
Glucose, UA: NEGATIVE
Ketones, UA: NEGATIVE
Nitrite, UA: POSITIVE — AB
Specific Gravity, UA: 1.025 (ref 1.005–1.030)
Urobilinogen, Ur: 0.2 mg/dL (ref 0.2–1.0)
pH, UA: 7 (ref 5.0–7.5)

## 2021-12-05 LAB — MICROSCOPIC EXAMINATION: Renal Epithel, UA: NONE SEEN /hpf

## 2021-12-05 MED ORDER — CEFDINIR 300 MG PO CAPS: 300.0000 mg | ORAL_CAPSULE | Freq: Two times a day (BID) | ORAL | 0 refills | Status: AC

## 2021-12-05 NOTE — Telephone Encounter (Signed)
Patients wife called and states that they received his results through [place] that he has a UTI.  He has so much sediment in his urine home health has to keep changing his catheter.  The wife is requesting an antibiotic for the patient. Please advise. ?

## 2021-12-06 NOTE — Telephone Encounter (Signed)
Wife aware

## 2021-12-09 ENCOUNTER — Telehealth: Payer: Self-pay

## 2021-12-09 LAB — URINE CULTURE

## 2021-12-09 MED ORDER — LEVOFLOXACIN 500 MG PO TABS: 500.0000 mg | ORAL_TABLET | Freq: Every day | ORAL | 0 refills | Status: DC

## 2021-12-09 NOTE — Addendum Note (Signed)
Addended by: Primus Bravo on: 12/09/2021 01:51 PM ? ? Modules accepted: Orders ? ?

## 2021-12-09 NOTE — Telephone Encounter (Signed)
Patient called with no answer. Detailed message left. Appointment changed. Request a call back to confirm appointment. ?

## 2021-12-09 NOTE — Telephone Encounter (Signed)
-----   Message from Primus Bravo, MD sent at 12/09/2021  1:51 PM EDT ----- ?Please notify Luke Quinn that his urine culture shows 2 bacteria.  Unfortunately, the bacteria are not sensitive to the same antibiotics so I have sent in a second antibiotic for him to take after he completes the [facility].  If these antibiotics do not clear up the UTI, he may need IV treatment. ?

## 2021-12-09 NOTE — Telephone Encounter (Signed)
Wife called and made aware. ?

## 2021-12-09 NOTE — Telephone Encounter (Signed)
Wife returned phone call and confirmed appointment ?

## 2021-12-09 NOTE — Telephone Encounter (Signed)
-----   Message from Primus Bravo, MD sent at 12/09/2021  2:05 PM EDT ----- ?Luke Quinn is scheduled for cysto tomorrow.  Since he is just starting appropriate antibiotics for a UTI, I would recommend rescheduling him for next week to allow treatment of UTI. ? ?

## 2021-12-11 ENCOUNTER — Other Ambulatory Visit: Payer: No Typology Code available for payment source | Admitting: Urology

## 2021-12-17 ENCOUNTER — Ambulatory Visit (INDEPENDENT_AMBULATORY_CARE_PROVIDER_SITE_OTHER): Payer: No Typology Code available for payment source | Admitting: Urology

## 2021-12-17 ENCOUNTER — Encounter: Payer: Self-pay | Admitting: Urology

## 2021-12-17 DIAGNOSIS — Z978 Presence of other specified devices: Secondary | ICD-10-CM

## 2021-12-17 DIAGNOSIS — R3914 Feeling of incomplete bladder emptying: Secondary | ICD-10-CM

## 2021-12-17 DIAGNOSIS — Z8546 Personal history of malignant neoplasm of prostate: Secondary | ICD-10-CM

## 2021-12-17 DIAGNOSIS — R339 Retention of urine, unspecified: Secondary | ICD-10-CM

## 2021-12-17 DIAGNOSIS — Z87440 Personal history of urinary (tract) infections: Secondary | ICD-10-CM | POA: Diagnosis not present

## 2021-12-17 DIAGNOSIS — N390 Urinary tract infection, site not specified: Secondary | ICD-10-CM

## 2021-12-17 MED ORDER — CIPROFLOXACIN HCL 500 MG PO TABS
500.0000 mg | ORAL_TABLET | Freq: Once | ORAL | Status: AC
  Administered 2021-12-17: 500 mg via ORAL

## 2021-12-17 NOTE — Progress Notes (Signed)
? ?Assessment: ?1. Incomplete bladder emptying   ?2. Frequent UTI   ?3. History of prostate cancer   ?4. Chronic indwelling Foley catheter   ? ? ?Plan: ?I again discussed suprapubic tube placement with Mr. Bob and his wife.  I again explained that even with a suprapubic tube, he would continue to have an increased risk of UTIs although slightly lower than with urethral catheter.  Given his multiple medical problems and his significant risk for any surgical procedure, I recommended placement of a suprapubic tube by interventional radiology. ?Urine culture today ?Schedule for SP tube placement by IR. ?Will need to hold Eliquis prior to procedure. ?Return about 1 month after procedure. ? ?Chief Complaint:  ?Chief Complaint  ?Patient presents with  ? Recurrent UTI  ? ? ?History of Present Illness: ? ?Luke Quinn is a 88 y.o. year old male who is seen for further evaluation of urinary retention and recurrent UTIs.   He is status post radical retropubic prostatectomy in 2002 and had been followed by the [facility] in [place] for prostate cancer.  PSA from 1/22 was <0.010.  He has a history of urinary incontinence, previously requiring incontinence pads on a regular basis.  Cystoscopy from January 2017 showed a frozen bladder neck without evidence of stricture.  More recently, he has had problems with incomplete bladder emptying and recurrent UTIs.  He has been seen by urology at the [facility] clinic.  Bladder scan from June 2022 showed 262 mL in the bladder. ?Renal ultrasound from May 2022 showed no hydronephrosis, right renal cyst, nondistended bladder.  He has been treated for recurrent UTIs.  A Foley catheter was placed in August 2022.  This has been changed monthly by home health care.  He has continued to have frequent UTIs requiring antibiotic therapy.  Prior to his visit in 2/23, he had completed a course of amoxicillin and Levaquin for a UTI.   ? ?He has late stage Parkinson's disease, history  of lung cancer, and is on chronic anticoagulation for atrial fibrillation. ? ?Urine culture results: ?2/23 >100 K Pseudomonas -treated with Omnicef ?3/23 >100 K [place], Pseudomonas -treated with Omnicef and Levaquin ? ?CT imaging from 11/22/2021 showed punctate nonobstructing bilateral nephrolithiasis, several small stones in the bladder, no evidence of obstruction. ? ?He presents today for further evaluation with cystoscopy.  He completed his antibiotics today.  His Foley has been draining well.  No fevers or chills.  No gross hematuria. ? ?Portions of the above documentation were copied from a prior visit for review purposes only. ? ? ?Past Medical History:  ?Past Medical History:  ?Diagnosis Date  ? AAA (abdominal aortic aneurysm)   ? status post EVAR November 2016.  ? Atrial fibrillation ([place]) 10/2020  ? Feb 2022, started on Eliquis  ? CAD (coronary artery disease)   ? multiple cardiac stents placed years ago.  ? Cancer [facility])   ? prostate  ? COPD (chronic obstructive pulmonary disease) ([place])   ? Depression   ? Hypertension   ? Lung cancer ([place])   ? right;  s/p SBRT treatment completed in November 2017  ? Macular degeneration, age related   ? Ocular hypertension   ? Parkinson's disease ([place])   ? Pulmonary embolism ([place])   ? Feb 2022, started on Eliquis  ? PVD (peripheral vascular disease) ([place])   ? Shingles   ? ? ?Past Surgical History:  ?Past Surgical History:  ?Procedure Laterality Date  ? cardiac stents    ?  PROSTATE SURGERY    ? ? ?Allergies:  ?Allergies  ?Allergen Reactions  ? Gabapentin   ?  Pt is unable to maintain his balance while taking this med ?  ? ? ?Family History:  ?Family History  ?Problem Relation Age of Onset  ? Diabetes Mother   ? Cancer Mother   ? Hypertension Mother   ? Diabetes Father   ? ? ?Social History:  ?Social History  ? ?Tobacco Use  ? Smoking status: Former  ?  Types: Cigarettes  ?  Quit date: 06/21/1995  ?  Years since quitting: 26.5  ? Smokeless tobacco: Never  ?Substance Use  Topics  ? Alcohol use: No  ?  Alcohol/week: 0.0 standard drinks  ? Drug use: No  ? ? ?ROS: ?Constitutional:  Negative for fever, chills, weight loss ?CV: Negative for chest pain, previous MI, hypertension ?Respiratory:  Negative for shortness of breath, wheezing, sleep apnea, frequent cough ?GI:  Negative for nausea, vomiting, bloody stool, GERD ? ?Physical exam: ?GENERAL APPEARANCE:  Well appearing, well developed, well nourished, NAD ?HEENT:  Atraumatic, normocephalic, oropharynx clear ?NECK:  Supple without lymphadenopathy or thyromegaly ?ABDOMEN:  Soft, non-tender, no masses ?EXTREMITIES:  No clubbing, cyanosis, or edema ?NEUROLOGIC:  Alert and oriented x 3,  CN II-XII grossly intact ?MENTAL STATUS:  appropriate ?BACK:  Non-tender to palpation, No CVAT ?SKIN:  Warm, dry, and intact ? ? ?Results: ?U/A: + LE, many bacteria ? ?Procedure:  Flexible Cystourethroscopy ? ?Pre-operative Diagnosis:  Recurrent UTIs, incomplete bladder emptying, chronic Foley catheter ? ?Post-operative Diagnosis: Recurrent UTIs, incomplete bladder emptying, chronic Foley catheter ? ?Anesthesia:  local with lidocaine jelly ? ?Surgical Narrative: ? ?After appropriate informed consent was obtained, the patient was prepped and draped in the usual sterile fashion in the supine position.  The patient was correctly identified and the proper procedure delineated prior to proceeding.  Sterile lidocaine gel was instilled in the urethra. ?The flexible cystoscope was introduced without difficulty. ? ?Findings: ? ?Anterior urethra: Normal ? ?Posterior urethra: Post radical prostatectomy ? ?Bladder:  Trabeculations, no mucosal lesions, small stones at bladder base ? ?Ureteral orifices: normal ? ?Additional findings: none ? ?Saline bladder wash for cytology was not performed.   ? ?The cystoscope was then removed.  The patient tolerated the procedure well. ? ? ?

## 2021-12-17 NOTE — Progress Notes (Signed)
Simple Catheter Placement ? ?Due to urinary retention patient is present today for a foley cath placement.  Patient was cleaned and prepped in a sterile fashion with betadine. A 18 FR silicone foley catheter was inserted, urine return was noted  21ml, urine was yellow in color.  The balloon was filled with 10cc of sterile water.  A bed bag was attached for drainage. Patient was also given a night bag to take home and was given instruction on how to change from one bag to another.  Patient was given instruction on proper catheter care.  Patient tolerated well, no complications were noted  ? ?Performed by: Levi Aland, CMA ? ?Additional notes/ Follow up: Follow up as scheduled.   ?

## 2021-12-18 ENCOUNTER — Encounter: Payer: Self-pay | Admitting: *Deleted

## 2021-12-18 NOTE — Progress Notes (Unsigned)
Suttle, Rosanne Ashing, MD  Roosvelt Maser ?Approved.  ? ?[place]   ?  ?   ?Previous Messages ?  ?----- Message -----  ?From: Roosvelt Maser  ?Sent: 12/18/2021  12:07 PM EDT  ?To: Ir Procedure Requests  ?Subject: CT guided bx                                  ? ? ? ?CT guided  ? ?Placement of SP tube  ?Incomplete bladder emptying  ?Chronic indwelling Foley catheter  ? ? ?Stoneking, Leory Plowman  ?[phone] ?

## 2021-12-18 NOTE — Addendum Note (Signed)
Addended by: Dorisann Frames on: 12/18/2021 09:51 AM ? ? Modules accepted: Orders ? ?

## 2021-12-22 LAB — URINE CULTURE

## 2021-12-23 MED ORDER — CEFPODOXIME PROXETIL 100 MG PO TABS: 100.0000 mg | ORAL_TABLET | Freq: Two times a day (BID) | ORAL | 0 refills | Status: AC

## 2021-12-23 NOTE — Addendum Note (Signed)
Addended by: Primus Bravo on: 12/23/2021 09:01 AM ? ? Modules accepted: Orders ? ?

## 2021-12-23 NOTE — Progress Notes (Signed)
Wife informed and voiced understanding.  ?

## 2021-12-24 ENCOUNTER — Other Ambulatory Visit: Payer: Self-pay | Admitting: Radiology

## 2021-12-25 ENCOUNTER — Ambulatory Visit (HOSPITAL_COMMUNITY)
Admission: RE | Admit: 2021-12-25 | Discharge: 2021-12-25 | Disposition: A | Discharge status: Home / Self Care | Payer: No Typology Code available for payment source | Source: Ambulatory Visit | Attending: Urology | Admitting: Urology

## 2021-12-25 ENCOUNTER — Other Ambulatory Visit: Payer: Self-pay

## 2021-12-25 ENCOUNTER — Encounter (HOSPITAL_COMMUNITY): Payer: Self-pay

## 2021-12-25 VITALS — BP 156/79 | HR 78 | Temp 97.8°F | Resp 16 | Ht 75.0 in | Wt 142.0 lb

## 2021-12-25 DIAGNOSIS — Z955 Presence of coronary angioplasty implant and graft: Secondary | ICD-10-CM | POA: Insufficient documentation

## 2021-12-25 DIAGNOSIS — Z85118 Personal history of other malignant neoplasm of bronchus and lung: Secondary | ICD-10-CM | POA: Diagnosis not present

## 2021-12-25 DIAGNOSIS — Z79899 Other long term (current) drug therapy: Secondary | ICD-10-CM | POA: Insufficient documentation

## 2021-12-25 DIAGNOSIS — I4891 Unspecified atrial fibrillation: Secondary | ICD-10-CM | POA: Insufficient documentation

## 2021-12-25 DIAGNOSIS — I10 Essential (primary) hypertension: Secondary | ICD-10-CM | POA: Insufficient documentation

## 2021-12-25 DIAGNOSIS — R339 Retention of urine, unspecified: Secondary | ICD-10-CM | POA: Insufficient documentation

## 2021-12-25 DIAGNOSIS — J449 Chronic obstructive pulmonary disease, unspecified: Secondary | ICD-10-CM | POA: Diagnosis not present

## 2021-12-25 DIAGNOSIS — N323 Diverticulum of bladder: Secondary | ICD-10-CM | POA: Diagnosis not present

## 2021-12-25 DIAGNOSIS — G20 Parkinson's disease: Secondary | ICD-10-CM | POA: Diagnosis not present

## 2021-12-25 DIAGNOSIS — I2510 Atherosclerotic heart disease of native coronary artery without angina pectoris: Secondary | ICD-10-CM | POA: Insufficient documentation

## 2021-12-25 DIAGNOSIS — N390 Urinary tract infection, site not specified: Secondary | ICD-10-CM

## 2021-12-25 DIAGNOSIS — I719 Aortic aneurysm of unspecified site, without rupture: Secondary | ICD-10-CM | POA: Diagnosis not present

## 2021-12-25 DIAGNOSIS — Z7901 Long term (current) use of anticoagulants: Secondary | ICD-10-CM | POA: Diagnosis not present

## 2021-12-25 DIAGNOSIS — Z978 Presence of other specified devices: Secondary | ICD-10-CM

## 2021-12-25 LAB — CBC
HCT: 40.6 % (ref 39.0–52.0)
Hemoglobin: 13.3 g/dL (ref 13.0–17.0)
MCH: 30.2 pg (ref 26.0–34.0)
MCHC: 32.8 g/dL (ref 30.0–36.0)
MCV: 92.3 fL (ref 80.0–100.0)
Platelets: 185 10*3/uL (ref 150–400)
RBC: 4.4 MIL/uL (ref 4.22–5.81)
RDW: 14.2 % (ref 11.5–15.5)
WBC: 9.8 10*3/uL (ref 4.0–10.5)
nRBC: 0 % (ref 0.0–0.2)

## 2021-12-25 LAB — PROTIME-INR
INR: 1.1 (ref 0.8–1.2)
Prothrombin Time: 13.6 seconds (ref 11.4–15.2)

## 2021-12-25 MED ORDER — MIDAZOLAM HCL 2 MG/2ML IJ SOLN
INTRAMUSCULAR | Status: AC
  Filled 2021-12-25: qty 2

## 2021-12-25 MED ORDER — FENTANYL CITRATE (PF) 100 MCG/2ML IJ SOLN
INTRAMUSCULAR | Status: AC | PRN
  Administered 2021-12-25 (×2): 12.5 ug via INTRAVENOUS

## 2021-12-25 MED ORDER — CIPROFLOXACIN IN D5W 400 MG/200ML IV SOLN
400.0000 mg | INTRAVENOUS | Status: AC
  Administered 2021-12-25: 400 mg via INTRAVENOUS
  Filled 2021-12-25: qty 200

## 2021-12-25 MED ORDER — MIDAZOLAM HCL 2 MG/2ML IJ SOLN
INTRAMUSCULAR | Status: AC | PRN
  Administered 2021-12-25 (×2): .5 mg via INTRAVENOUS

## 2021-12-25 MED ORDER — LIDOCAINE HCL 1 % IJ SOLN
INTRAMUSCULAR | Status: AC
  Filled 2021-12-25: qty 10

## 2021-12-25 MED ORDER — FENTANYL CITRATE (PF) 100 MCG/2ML IJ SOLN
INTRAMUSCULAR | Status: AC
  Filled 2021-12-25: qty 2

## 2021-12-25 NOTE — Progress Notes (Signed)
Interventional Radiology Brief Note: ? ?PA to bedside to discuss drain care.  All questions answered from wife and daughter.  They are aware of plans to return to IR in ~6 weeks for upsize.  Schedulers to arrange, order placed.  ? ?Brynda Greathouse, MS RD PA-C ?3:58 PM ? ? ?

## 2021-12-25 NOTE — Sedation Documentation (Signed)
Foley catheter removed per verbal order from Dr. Anselm Pancoast. Suprapubic catheter draining clear, yellow urine.  ?

## 2021-12-25 NOTE — Progress Notes (Signed)
Client's wife with multiple questions about surprapubic catheter and Kasey,PA notified and in to see client and his wife and daughter ?

## 2021-12-25 NOTE — Procedures (Signed)
Interventional Radiology Procedure: ? ? ?Indications: Incomplete bladder emptying and chronic foley. ? ?Procedure: Placement of suprapubic catheter ? ?Findings: Bladder distended with saline through existing foley catheter. 14 Fr drain placed in bladder.  ? ?Complications: No immediate complications noted. ?    ?EBL: Minimal ? ?Plan: Discharge to home and plan for drain upsize in approximately 6 weeks ? ? ?Valori Hollenkamp R. Anselm Pancoast, MD  ?Pager: [phone] ? ? ? ?  ?

## 2021-12-25 NOTE — H&P (Signed)
? ?Chief Complaint: ?Patient was seen in consultation today for urinary retention ? ?Referring Physician(s): ?Stoneking,Bradley J. ? ?Supervising Physician: Markus Daft ? ?Patient Status: [facility] - Out-pt ? ?History of Present Illness: ?Luke Quinn is a 88 y.o. male with past medical history of a fib on Eliquis, CAD s/p stent placements, COPD, HTN, right lung cancer, Parkinson's disease, and chronic indwelling foley catheter who presents to [facility]iology today for suprapubic catheter placement.  ? ?Patient is accompanied by his wife today.  She reports he is in his usual state of health.  Foley catheter in place with yellow urine.  Patient does have mild contracture associated with his Parkinson's and has difficulty lying flat.  ? ?He has been NPO for the procedure today.  ?His wife stopped Eliquis 2 days ago as instructed.  ?They are agreeable to procedure today.  ? ?Past Medical History:  ?Diagnosis Date  ? AAA (abdominal aortic aneurysm) ([place])   ? status post EVAR November 2016.  ? Atrial fibrillation ([place]) 10/2020  ? Feb 2022, started on Eliquis  ? CAD (coronary artery disease)   ? multiple cardiac stents placed years ago.  ? Cancer [facility])   ? prostate  ? COPD (chronic obstructive pulmonary disease) ([place])   ? Depression   ? Hypertension   ? Lung cancer ([place])   ? right;  s/p SBRT treatment completed in November 2017  ? Macular degeneration, age related   ? Ocular hypertension   ? Parkinson's disease ([place])   ? Pulmonary embolism ([place])   ? Feb 2022, started on Eliquis  ? PVD (peripheral vascular disease) ([place])   ? Shingles   ? ? ?Past Surgical History:  ?Procedure Laterality Date  ? cardiac stents    ? PROSTATE SURGERY    ? ? ?Allergies: ?Gabapentin ? ?Medications: ?Prior to Admission medications   ?Medication Sig Start Date End Date Taking? Authorizing Provider  ?acetaminophen (TYLENOL) 500 MG tablet Take 500 mg by mouth every 6 (six) hours as needed.   Yes [provider]  ?carbidopa-levodopa (SINEMET  IR) 25-100 MG tablet Take 2 tablets by mouth 4 (four) times daily.   Yes [provider]  ?Carboxymethylcellulose Sodium 0.25 % SOLN Apply 2 drops to eye QID.   Yes [provider]  ?cefpodoxime (VANTIN) 100 MG tablet Take 1 tablet (100 mg total) by mouth 2 (two) times daily for 7 days. 12/23/21 12/30/21 Yes Stoneking, Reece Leader., MD  ?ferrous sulfate 325 (65 FE) MG tablet Take 325 mg by mouth daily with breakfast.   Yes [provider]  ?GLUCOSAMINE CHONDROITIN MSM PO Take 500 mg by mouth in the morning and at bedtime.   Yes [provider]  ?hydrALAZINE (APRESOLINE) 100 MG tablet Take 100 mg by mouth 2 (two) times daily.   Yes [provider]  ?losartan (COZAAR) 100 MG tablet Take 100 mg by mouth daily.   Yes [provider]  ?Multiple Vitamins-Minerals (ICAPS AREDS 2 PO) Take by mouth 2 (two) times daily.   Yes [provider]  ?omega-3 acid ethyl esters (LOVAZA) 1 g capsule Take 1 g by mouth daily.   Yes [provider]  ?omeprazole (PRILOSEC) 20 MG capsule Take 20 mg by mouth 2 (two) times daily before a meal.   Yes [provider]  ?POTASSIUM CHLORIDE PO Take 20 mEq by mouth 2 (two) times daily.   Yes [provider]  ?rasagiline (AZILECT) 0.5 MG TABS tablet Take 1 mg by mouth daily.  Yes [provider]  ?rasagiline (AZILECT) 1 MG TABS tablet Take 1 tablet by mouth daily.   Yes [provider]  ?apixaban (ELIQUIS) 5 MG TABS tablet Take 1 tablet (5 mg total) by mouth 2 (two) times daily. 10/23/20   Daleen Bo, MD  ?atorvastatin (LIPITOR) 40 MG tablet Take 40 mg by mouth daily.    [provider]  ?hydrochlorothiazide (HYDRODIURIL) 25 MG tablet Take 25 mg by mouth daily.    [provider]  ?levofloxacin (LEVAQUIN) 500 MG tablet Take 1 tablet (500 mg total) by mouth daily. ?Patient not taking: Reported on 12/17/2021 12/09/21   Stoneking, Reece Leader., MD  ?lidocaine (LIDODERM) 5 % Place 1  patch onto the skin daily.    [provider]  ?lidocaine (LMX) 4 % cream Apply 1 application topically 3 (three) times daily as needed. Apply small amount to affected area three times a day as needed for back pain  Per medication list from [place]    [provider]  ?Molnupiravir 200 MG CAPS Take 4 capsules by mouth every 12 (twelve) hours. 10/20/20   [provider]  ?senna-docusate (SENOKOT-S) 8.6-50 MG tablet Take 2 tablets by mouth at bedtime. ?Patient not taking: Reported on 12/17/2021    [provider]  ?  ? ?Family History  ?Problem Relation Age of Onset  ? Diabetes Mother   ? Cancer Mother   ? Hypertension Mother   ? Diabetes Father   ? ? ?Social History  ? ?Socioeconomic History  ? Marital status: Married  ?  Spouse name: Not on file  ? Number of children: Not on file  ? Years of education: Not on file  ? Highest education level: Not on file  ?Occupational History  ? Not on file  ?Tobacco Use  ? Smoking status: Former  ?  Types: Cigarettes  ?  Quit date: 06/21/1995  ?  Years since quitting: 26.5  ? Smokeless tobacco: Never  ?Substance and Sexual Activity  ? Alcohol use: No  ?  Alcohol/week: 0.0 standard drinks  ? Drug use: No  ? Sexual activity: Not on file  ?Other Topics Concern  ? Not on file  ?Social History Narrative  ? Not on file  ? ?Social Determinants of Health  ? ?Financial Resource Strain: Not on file  ?Food Insecurity: Not on file  ?Transportation Needs: Not on file  ?Physical Activity: Not on file  ?Stress: Not on file  ?Social Connections: Not on file  ? ? ? ?Review of Systems: A 12 point ROS discussed and pertinent positives are indicated in the HPI above.  All other systems are negative. ? ?Review of Systems  ?Constitutional:  Negative for fatigue and fever.  ?Respiratory:  Negative for cough and shortness of breath.   ?Cardiovascular:  Negative for chest pain.  ?Gastrointestinal:  Negative for abdominal pain.  ?Musculoskeletal:  Negative for back pain.   ?Psychiatric/Behavioral:  Negative for behavioral problems and confusion.   ? ?Vital Signs: ?BP 127/83   Pulse 81   Temp 97.8 ?F (36.6 ?C) (Oral)   Resp 16   Ht 6\' 3"  (1.905 m)   Wt 142 lb (64.4 kg)   SpO2 94%   BMI 17.75 kg/m?  ? ?Physical Exam ?Vitals and nursing note reviewed.  ?Constitutional:   ?   General: He is not in acute distress. ?   Appearance: Normal appearance. He is not ill-appearing.  ?HENT:  ?   Mouth/Throat:  ?   Mouth: Mucous membranes  are moist.  ?   Pharynx: Oropharynx is clear.  ?Cardiovascular:  ?   Rate and Rhythm: Normal rate and regular rhythm.  ?Pulmonary:  ?   Effort: Pulmonary effort is normal.  ?   Breath sounds: Normal breath sounds.  ?Abdominal:  ?   General: Abdomen is flat. There is no distension.  ?   Palpations: Abdomen is soft.  ?Genitourinary: ?   Comments: Foley in place. ?Skin: ?   General: Skin is warm and dry.  ?Neurological:  ?   General: No focal deficit present.  ?   Mental Status: He is alert and oriented to person, place, and time. Mental status is at baseline.  ?Psychiatric:     ?   Mood and Affect: Mood normal.     ?   Behavior: Behavior normal.     ?   Thought Content: Thought content normal.     ?   Judgment: Judgment normal.  ? ? ? ?MD Evaluation ?Airway: WNL ?Heart: WNL ?Abdomen: WNL ?Chest/ Lungs: WNL ?ASA  Classification: 3 ?Mallampati/Airway Score: Two ? ? ?Imaging: ?No results found. ? ?Labs: ? ?CBC: ?Recent Labs  ?  12/25/21 ?1146  ?WBC 9.8  ?HGB 13.3  ?HCT 40.6  ?PLT 185  ? ? ?COAGS: ?Recent Labs  ?  12/25/21 ?1146  ?INR 1.1  ? ? ?BMP: ?No results for input(s): NA, K, CL, CO2, GLUCOSE, BUN, CALCIUM, CREATININE, GFRNONAA, GFRAA in the last 8760 hours. ? ?Invalid input(s): CMP ? ?LIVER FUNCTION TESTS: ?No results for input(s): BILITOT, AST, ALT, ALKPHOS, PROT, ALBUMIN in the last 8760 hours. ? ?TUMOR MARKERS: ?No results for input(s): AFPTM, CEA, CA199, CHROMGRNA in the last 8760 hours. ? ?Assessment and Plan: ?Patient with past medical history of a  fib, COPD, Parkinson's disease presents with complaint of urinary retention, chronic indwelling foley catheter.  ?IR consulted for suprapubic catheter placement at the request of Dr. Felipa Eth. ?Case reviewed by Dr. Anselm Pancoast wh

## 2022-01-01 ENCOUNTER — Telehealth: Payer: Self-pay

## 2022-01-01 NOTE — Telephone Encounter (Signed)
Received voicemail from wife asking about follow up appointment for SP tube change. ? ?Per IR note on 04/12- patient will return to IR for upsize SP tube. Detailed message left for wife that IR should reach back for 6 week f/u but if not, notify our office  ?

## 2022-01-04 ENCOUNTER — Other Ambulatory Visit: Payer: Self-pay | Admitting: Radiology

## 2022-01-04 MED ORDER — CEFPODOXIME PROXETIL 100 MG PO TABS: 100.0000 mg | ORAL_TABLET | Freq: Two times a day (BID) | ORAL | 0 refills | Status: DC

## 2022-01-06 ENCOUNTER — Telehealth: Payer: Self-pay

## 2022-01-06 ENCOUNTER — Telehealth (HOSPITAL_COMMUNITY): Payer: Self-pay

## 2022-01-06 ENCOUNTER — Encounter: Payer: Self-pay | Admitting: Urology

## 2022-01-06 NOTE — Telephone Encounter (Signed)
Wife had to get IR to call in an antibiotic, she states that over the weekend pt was running a fever and had confusion.  Patient is doing better now and his temp is back normal.  Wife states that the she was told by a nurse that IR had completely taken over his care.  Informed her that IR will be over his care until his tube is upsized, after that she will follow up with MD here.  She voiced her frustration with the fact that if IR had not sent in an antibiotic that she doesn't know where they would be with his care.  She also voiced not being happy with the miscommunication of the patients care.  I apologized for the confusion and let her know we had no documentation of the conversation she described.  I instructed her to call back with any other concerns.  ?

## 2022-01-06 NOTE — Telephone Encounter (Signed)
Please see patient message- I am not sure who they talked with Friday in the office ( I was out of the office)  ?

## 2022-01-06 NOTE — Telephone Encounter (Signed)
-----   Message from Corrie Mckusick, DO sent at 01/04/2022 12:51 PM EDT ----- ?Team, good day.  ? ?This patient (in not already admitted over weekend) will need to be scheduled next week for image guided suprapubic drain exchange/upsize.   ? ?It is early, but it is indicated.   ? ?Goal will be to get to 37F, given that was the previous foley cath size that he needed in order to deal with high sedimentation rate of urine.  ? ?Would arrange for moderate sedation.  ? ?Thank you ?Earleen Newport ? ? ?

## 2022-01-07 ENCOUNTER — Other Ambulatory Visit: Payer: Self-pay | Admitting: Urology

## 2022-01-07 DIAGNOSIS — N390 Urinary tract infection, site not specified: Secondary | ICD-10-CM

## 2022-01-07 MED ORDER — METHENAMINE HIPPURATE 1 G PO TABS: 1.0000 g | ORAL_TABLET | Freq: Two times a day (BID) | ORAL | 5 refills | Status: DC

## 2022-01-07 NOTE — Telephone Encounter (Signed)
I spoke with Luke Quinn today.  Her husband is improving with his current antibiotic regimen.  I recommended that he complete the Vantin as prescribed.  He is scheduled for upsizing of his suprapubic tube at the end of this week.  I reviewed the continued risk for UTIs with a chronic suprapubic catheter.  I also discussed the importance of treating UTIs when symptomatic and did not recommend long-term antibiotic therapy to avoid antibiotic resistance.  I recommended a trial of methenamine for UTI prevention.  Prescription sent. ?We will make arrangements for follow-up in approximately 1 month for SP tube change. ?

## 2022-01-08 ENCOUNTER — Telehealth (HOSPITAL_COMMUNITY): Payer: Self-pay

## 2022-01-08 NOTE — Telephone Encounter (Signed)
-----   Message from Corrie Mckusick, DO sent at 01/07/2022  8:42 AM EDT ----- ?Thank you.  ?Lets just have him hold the dose the day before -- 24hrs.  ? ?Thank you ?JW ? ?----- Message ----- ?From: Gildardo Pounds D ?Sent: 01/06/2022   9:32 AM EDT ?To: Corrie Mckusick, DO ? ?Earleen Newport,  ? ?I got him scheduled for Friday. He is on Eliquis. Does he need to hold this? ? ?----- Message ----- ?From: Corrie Mckusick, DO ?Sent: 01/04/2022  12:53 PM EDT ?To: Harlene Salts, # ? ?Team, good day.  ? ?This patient (in not already admitted over weekend) will need to be scheduled next week for image guided suprapubic drain exchange/upsize.   ? ?It is early, but it is indicated.   ? ?Goal will be to get to 61F, given that was the previous foley cath size that he needed in order to deal with high sedimentation rate of urine.  ? ?Would arrange for moderate sedation.  ? ?Thank you ?Earleen Newport ? ? ? ? ?

## 2022-01-09 ENCOUNTER — Other Ambulatory Visit: Payer: Self-pay | Admitting: Student

## 2022-01-10 ENCOUNTER — Other Ambulatory Visit: Payer: Self-pay

## 2022-01-10 ENCOUNTER — Ambulatory Visit (HOSPITAL_COMMUNITY)
Admission: RE | Admit: 2022-01-10 | Discharge: 2022-01-10 | Disposition: A | Discharge status: Home / Self Care | Payer: No Typology Code available for payment source | Source: Ambulatory Visit | Attending: Student | Admitting: Student

## 2022-01-10 DIAGNOSIS — G20 Parkinson's disease: Secondary | ICD-10-CM | POA: Diagnosis not present

## 2022-01-10 DIAGNOSIS — Z7901 Long term (current) use of anticoagulants: Secondary | ICD-10-CM | POA: Diagnosis not present

## 2022-01-10 DIAGNOSIS — J449 Chronic obstructive pulmonary disease, unspecified: Secondary | ICD-10-CM | POA: Insufficient documentation

## 2022-01-10 DIAGNOSIS — Z955 Presence of coronary angioplasty implant and graft: Secondary | ICD-10-CM | POA: Insufficient documentation

## 2022-01-10 DIAGNOSIS — I10 Essential (primary) hypertension: Secondary | ICD-10-CM | POA: Diagnosis not present

## 2022-01-10 DIAGNOSIS — Z87891 Personal history of nicotine dependence: Secondary | ICD-10-CM | POA: Insufficient documentation

## 2022-01-10 DIAGNOSIS — I2510 Atherosclerotic heart disease of native coronary artery without angina pectoris: Secondary | ICD-10-CM | POA: Diagnosis not present

## 2022-01-10 DIAGNOSIS — Z435 Encounter for attention to cystostomy: Secondary | ICD-10-CM | POA: Diagnosis present

## 2022-01-10 DIAGNOSIS — I4891 Unspecified atrial fibrillation: Secondary | ICD-10-CM | POA: Insufficient documentation

## 2022-01-10 DIAGNOSIS — N390 Urinary tract infection, site not specified: Secondary | ICD-10-CM

## 2022-01-10 DIAGNOSIS — R339 Retention of urine, unspecified: Secondary | ICD-10-CM

## 2022-01-10 HISTORY — PX: IR CATHETER TUBE CHANGE: IMG717

## 2022-01-10 MED ORDER — SODIUM CHLORIDE 0.9 % IV SOLN
INTRAVENOUS | Status: AC | PRN
  Administered 2022-01-10: 10 mL/h via INTRAVENOUS

## 2022-01-10 MED ORDER — FENTANYL CITRATE (PF) 100 MCG/2ML IJ SOLN
INTRAMUSCULAR | Status: AC | PRN
  Administered 2022-01-10: 25 ug via INTRAVENOUS

## 2022-01-10 MED ORDER — MIDAZOLAM HCL 2 MG/2ML IJ SOLN
INTRAMUSCULAR | Status: AC | PRN
  Administered 2022-01-10: .5 mg via INTRAVENOUS

## 2022-01-10 MED ORDER — MIDAZOLAM HCL 2 MG/2ML IJ SOLN
INTRAMUSCULAR | Status: AC
  Filled 2022-01-10: qty 2

## 2022-01-10 MED ORDER — FENTANYL CITRATE (PF) 100 MCG/2ML IJ SOLN
INTRAMUSCULAR | Status: AC
  Filled 2022-01-10: qty 2

## 2022-01-10 MED ORDER — SODIUM CHLORIDE 0.9 % IV SOLN: INTRAVENOUS | Status: DC

## 2022-01-10 MED ORDER — LIDOCAINE HCL 1 % IJ SOLN
INTRAMUSCULAR | Status: AC
  Filled 2022-01-10: qty 20

## 2022-01-10 MED ORDER — IOHEXOL 300 MG/ML  SOLN
100.0000 mL | Freq: Once | INTRAMUSCULAR | Status: AC | PRN
  Administered 2022-01-10: 5 mL

## 2022-01-10 NOTE — H&P (Signed)
? ?Chief Complaint: ?Urinary retention. Patient presents for supra pubic catheter exchange and upsize. ? ?Referring Physician(s): ?Dr. Alvie Heidelberg ? ?Supervising Physician: Jacqulynn Cadet ? ?Patient Status: [facility] - Out-pt ? ?History of Present Illness: ?Luke Quinn is a 88 y.o. male outpatient. 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 IR placed a 14Fr suprapubic catheter on 4.12.23. Patient presents for suprapubic catheter exchange and upsize. ? ?Patient laying in bed, wife at bedside.  Patient as altered mental but at baseline per wife at bedside. She states that there have been no issues with the pre-existing supra pubic catheter however patient did have a UTI since placement and is currently on antibiotics.. Return precautions and treatment recommendations and follow-up discussed with the patient's  wife who is agreeable with the plan. ? ? ? ?Past Medical History:  ?Diagnosis Date  ? AAA (abdominal aortic aneurysm) ([place])   ? status post EVAR November 2016.  ? Atrial fibrillation ([place]) 10/2020  ? Feb 2022, started on Eliquis  ? CAD (coronary artery disease)   ? multiple cardiac stents placed years ago.  ? Cancer [facility])   ? prostate  ? COPD (chronic obstructive pulmonary disease) ([place])   ? Depression   ? Hypertension   ? Lung cancer ([place])   ? right;  s/p SBRT treatment completed in November 2017  ? Macular degeneration, age related   ? Ocular hypertension   ? Parkinson's disease ([place])   ? Pulmonary embolism ([place])   ? Feb 2022, started on Eliquis  ? PVD (peripheral vascular disease) ([place])   ? Shingles   ? ? ?Past Surgical History:  ?Procedure Laterality Date  ? cardiac stents    ? PROSTATE SURGERY    ? ? ?Allergies: ?Gabapentin and Atorvastatin ? ?Medications: ?Prior to Admission medications   ?Medication Sig Start Date End Date Taking? Authorizing Provider  ?acetaminophen  (TYLENOL) 500 MG tablet Take 500 mg by mouth 2 (two) times daily.   Yes [provider]  ?albuterol (PROVENTIL) (2.5 MG/3ML) 0.083% nebulizer solution Take 2.5 mg by nebulization every 6 (six) hours as needed for wheezing or shortness of breath.   Yes [provider]  ?apixaban (ELIQUIS) 5 MG TABS tablet Take 1 tablet (5 mg total) by mouth 2 (two) times daily. 10/23/20  Yes Daleen Bo, MD  ?carbidopa-levodopa (SINEMET CR) 50-200 MG tablet Take 1 tablet by mouth at bedtime.   Yes [provider]  ?carbidopa-levodopa (SINEMET IR) 25-100 MG tablet Take 2 tablets by mouth 5 (five) times daily.   Yes [provider]  ?carboxymethylcellulose 1 % ophthalmic solution Place 1 drop into both eyes 5 (five) times daily.   Yes [provider]  ?cefpodoxime (VANTIN) 100 MG tablet Take 1 tablet (100 mg total) by mouth 2 (two) times daily. 01/04/22  Yes Bruning, Lennette Bihari, PA-C  ?cholecalciferol (VITAMIN D3) 25 MCG (1000 UNIT) tablet Take 1,000 Units by mouth daily.   Yes [provider]  ?Coenzyme Q10 (COQ10) 100 MG CAPS Take 100 mg by mouth daily.   Yes [provider]  ?diclofenac Sodium (VOLTAREN) 1 % GEL Apply 1 application. topically 4 (four) times daily as needed (pain).   Yes [provider]  ?famotidine (PEPCID) 20 MG tablet Take 20 mg by mouth daily.   Yes [provider]  ?ferrous sulfate 325 (65 FE) MG tablet Take 325 mg by mouth every Monday, Wednesday,  and Friday.   Yes [provider]  ?furosemide (LASIX) 20 MG tablet Take 10 mg by mouth daily.   Yes [provider]  ?GLUCOSAMINE CHONDROITIN MSM PO Take 1 tablet by mouth daily.   Yes [provider]  ?guaifenesin (HUMIBID E) 400 MG TABS tablet Take 400 mg by mouth 3 (three) times daily.   Yes [provider]  ?hydrALAZINE (APRESOLINE) 100 MG tablet Take 100 mg by mouth 2 (two) times daily.   Yes [provider]  ?latanoprost (XALATAN) 0.005 %  ophthalmic solution Place 1 drop into both eyes at bedtime.   Yes [provider]  ?lidocaine (LIDODERM) 5 % Place 1 patch onto the skin daily as needed (back pain).   Yes [provider]  ?lidocaine (LMX) 4 % cream Apply 1 application. topically 3 (three) times daily as needed (back pain).   Yes [provider]  ?losartan (COZAAR) 100 MG tablet Take 100 mg by mouth daily.   Yes [provider]  ?Melatonin 5 MG CAPS Take 5 mg by mouth at bedtime.   Yes [provider]  ?metoprolol tartrate (LOPRESSOR) 50 MG tablet Take 25 mg by mouth 2 (two) times daily.   Yes [provider]  ?Multiple Vitamins-Minerals (ICAPS AREDS 2 PO) Take 1 capsule by mouth 2 (two) times daily.   Yes [provider]  ?Nutritional Supplements (ENSURE PO) Take 1 Container by mouth in the morning, at noon, in the evening, and at bedtime.   Yes [provider]  ?nystatin cream (MYCOSTATIN) Apply 1 application. topically daily.   Yes [provider]  ?Omega-3 Fatty Acids (FISH OIL) 1000 MG CAPS Take 1,000 mg by mouth daily.   Yes [provider]  ?polyethylene glycol (MIRALAX / GLYCOLAX) 17 g packet Take 17 g by mouth daily.   Yes [provider]  ?potassium chloride SA (KLOR-CON M) 20 MEQ tablet Take 20 mEq by mouth 2 (two) times daily.   Yes [provider]  ?prazosin (MINIPRESS) 1 MG capsule Take 1 mg by mouth at bedtime.   Yes [provider]  ?rasagiline (AZILECT) 1 MG TABS tablet Take 1 tablet by mouth daily.   Yes [provider]  ?Jay Schlichter Oil (LUBRICANT EYE) OINT Place 1 application. into both eyes at bedtime.   Yes [provider]  ?methenamine (HIPREX) 1 g tablet Take 1 tablet (1 g total) by mouth 2 (two) times daily with a meal. 01/07/22   Stoneking, Reece Leader., MD  ?  ? ?Family History  ?Problem Relation Age of Onset  ? Diabetes Mother   ? Cancer Mother   ? Hypertension Mother   ?  Diabetes Father   ? ? ?Social History  ? ?Socioeconomic History  ? Marital status: Married  ?  Spouse name: Not on file  ? Number of children: Not on file  ? Years of education: Not on file  ? Highest education level: Not on file  ?Occupational History  ? Not on file  ?Tobacco Use  ? Smoking status: Former  ?  Types: Cigarettes  ?  Quit date: 06/21/1995  ?  Years since quitting: 26.5  ? Smokeless tobacco: Never  ?Substance and Sexual Activity  ? Alcohol use: No  ?  Alcohol/week: 0.0 standard drinks  ? Drug use: No  ? Sexual activity: Not on file  ?Other Topics Concern  ? Not on file  ?Social History Narrative  ? Not on file  ? ?Social Determinants of  Health  ? ?Financial Resource Strain: Not on file  ?Food Insecurity: Not on file  ?Transportation Needs: Not on file  ?Physical Activity: Not on file  ?Stress: Not on file  ?Social Connections: Not on file  ? ? ?Review of Systems: A 12 point ROS discussed and pertinent positives are indicated in the HPI above.  All other systems are negative. ? ?Review of Systems  ?Constitutional:  Negative for fever.  ?HENT:  Negative for congestion.   ?Respiratory:  Negative for cough and shortness of breath.   ?Cardiovascular:  Negative for chest pain.  ?Gastrointestinal:  Negative for abdominal pain.  ?Neurological:  Negative for headaches.  ?Psychiatric/Behavioral:  Positive for confusion (baseline per wife at bedside).   ? ?Vital Signs: ?BP 102/81   Pulse 77   Temp 98.5 ?F (36.9 ?C) (Oral)   Resp 18   Ht 6\' 3"  (1.905 m)   Wt 142 lb (64.4 kg)   SpO2 93%   BMI 17.75 kg/m?  ? ?Physical Exam ?Vitals and nursing note reviewed.  ?Constitutional:   ?   Appearance: He is well-developed.  ?HENT:  ?   Head: Normocephalic.  ?Cardiovascular:  ?   Rate and Rhythm: Normal rate and regular rhythm.  ?   Heart sounds: Normal heart sounds.  ?Pulmonary:  ?   Effort: Pulmonary effort is normal.  ?Genitourinary: ?   Comments: Supra pubic catheter in place. Urine clear with no sediment noted.   ?Musculoskeletal:     ?   General: Normal range of motion.  ?   Cervical back: Normal range of motion.  ?Skin: ?   General: Skin is dry.  ?Neurological:  ?   Mental Status: He is alert and oriented to person, place, and time.

## 2022-01-10 NOTE — Sedation Documentation (Signed)
Pt DC home by medical transport with wife. Awake and alert. In no distress. Suprapubic catheter draining yellow urine. ?

## 2022-01-13 ENCOUNTER — Other Ambulatory Visit (HOSPITAL_COMMUNITY): Payer: Self-pay | Admitting: Student

## 2022-01-20 ENCOUNTER — Ambulatory Visit (INDEPENDENT_AMBULATORY_CARE_PROVIDER_SITE_OTHER): Payer: No Typology Code available for payment source | Admitting: Physician Assistant

## 2022-01-20 VITALS — BP 124/74 | HR 74 | Ht 75.0 in | Wt 142.0 lb

## 2022-01-20 DIAGNOSIS — Z7901 Long term (current) use of anticoagulants: Secondary | ICD-10-CM | POA: Diagnosis not present

## 2022-01-20 DIAGNOSIS — Z9359 Other cystostomy status: Secondary | ICD-10-CM | POA: Diagnosis not present

## 2022-01-20 DIAGNOSIS — R319 Hematuria, unspecified: Secondary | ICD-10-CM

## 2022-01-20 DIAGNOSIS — N390 Urinary tract infection, site not specified: Secondary | ICD-10-CM | POA: Diagnosis not present

## 2022-01-20 DIAGNOSIS — R310 Gross hematuria: Secondary | ICD-10-CM | POA: Diagnosis not present

## 2022-01-20 LAB — URINALYSIS, ROUTINE W REFLEX MICROSCOPIC
Bilirubin, UA: NEGATIVE
Glucose, UA: NEGATIVE
Ketones, UA: NEGATIVE
Nitrite, UA: POSITIVE — AB
Specific Gravity, UA: 1.025 (ref 1.005–1.030)
Urobilinogen, Ur: 0.2 mg/dL (ref 0.2–1.0)
pH, UA: 5.5 (ref 5.0–7.5)

## 2022-01-20 MED ORDER — CEFDINIR 300 MG PO CAPS: 300.0000 mg | ORAL_CAPSULE | Freq: Two times a day (BID) | ORAL | 0 refills | Status: DC

## 2022-01-20 NOTE — Progress Notes (Signed)
? ?Assessment: ?1. Gross hematuria ?- Urinalysis, Routine w reflex microscopic ?- Urine Culture ? ?2. Urinary tract infection with hematuria, site unspecified ?- Urine Culture ? ?3. Chronic suprapubic catheter ([place]) ? ?4. Anticoagulant long-term use ?  ? ?Plan: ?Cath irrigated and urine sent for culture due to nitrite positive status with leukocyte presence. Resume Omnicef and follow with Hiprex at a QD dose rather than BID. Continue SP care as before and keep FU later this month with Dr. Felipa Eth. If hematuria does not clear his wife will let us know. If he develops symptoms of infection he is advised to go to the emergency department. ? ?Chief Complaint: ?No chief complaint on file. ? ? ?HPI: ?Luke Quinn is a 88 y.o. male with late stage Parkinson's who presents for evaluation of new onset gross hematuria coming from SP tube and urethral orifice since yesterday. Recent placement of SP catheter due to recurrent UTIs with foley. Upsize performed last week 01/10/22. Pt remains on Eliquis and started methenamine BID on 01/07/22. No fever, chills, body aches. No change in mental status. Cather has been draining well. ? ?UA: Nitrite positive with 2+leuks. No micro due to scant amount of urine in bladder to examine ? ?01/10/22 ?Luke Quinn is a 88 y.o. male patient. 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 IR placed a 14Fr suprapubic catheter on 4.12.23. Patient presents for suprapubic catheter exchange and upsize. ? ?12/17/21 ?Luke Quinn is a 88 y.o. year old male who is seen for further evaluation of urinary retention and recurrent UTIs.   He is status post radical retropubic prostatectomy in 2002 and had been followed by the [facility] in [place] for prostate cancer.  PSA from 1/22 was <0.010.  He has a history of urinary incontinence, previously requiring incontinence pads  on a regular basis.  Cystoscopy from January 2017 showed a frozen bladder neck without evidence of stricture.  More recently, he has had problems with incomplete bladder emptying and recurrent UTIs.  He has been seen by urology at the [facility] clinic.  Bladder scan from June 2022 showed 262 mL in the bladder. ?Renal ultrasound from May 2022 showed no hydronephrosis, right renal cyst, nondistended bladder.  He has been treated for recurrent UTIs.  A Foley catheter was placed in August 2022.  This has been changed monthly by home health care.  He has continued to have frequent UTIs requiring antibiotic therapy.  Prior to his visit in 2/23, he had completed a course of amoxicillin and Levaquin for a UTI.   ?  ?He has late stage Parkinson's disease, history of lung cancer, and is on chronic anticoagulation for atrial fibrillation. ?  ?Urine culture results: ?2/23     >100 K Pseudomonas -treated with Omnicef ?3/23     >100 K Morganella, Pseudomonas -treated with Omnicef and Levaquin ?  ?CT imaging from 11/22/2021 showed punctate nonobstructing bilateral nephrolithiasis, several small stones in the bladder, no evidence of obstruction. ?  ?He presents today for further evaluation with cystoscopy.  He completed his antibiotics today.  His Foley has been draining well.  No fevers or chills.  No gross hematuria. ?  ?Portions of the above documentation were copied from a prior visit for review purposes only. ? ?Allergies: ?Allergies  ?Allergen Reactions  ? Gabapentin   ?  Pt is unable to maintain his balance while taking  this med ?  ? Atorvastatin   ?  Ankle swelling  ? ? ?PMH: ?Past Medical History:  ?Diagnosis Date  ? AAA (abdominal aortic aneurysm) ([place])   ? status post EVAR November 2016.  ? Atrial fibrillation ([place]) 10/2020  ? Feb 2022, started on Eliquis  ? CAD (coronary artery disease)   ? multiple cardiac stents placed years ago.  ? Cancer [facility])   ? prostate  ? COPD (chronic obstructive pulmonary disease) ([place])    ? Depression   ? Hypertension   ? Lung cancer ([place])   ? right;  s/p SBRT treatment completed in November 2017  ? Macular degeneration, age related   ? Ocular hypertension   ? Parkinson's disease ([place])   ? Pulmonary embolism ([place])   ? Feb 2022, started on Eliquis  ? PVD (peripheral vascular disease) ([place])   ? Shingles   ? ? ?PSH: ?Past Surgical History:  ?Procedure Laterality Date  ? cardiac stents    ? IR CATHETER TUBE CHANGE  01/10/2022  ? PROSTATE SURGERY    ? ? ?SH: ?Social History  ? ?Tobacco Use  ? Smoking status: Former  ?  Types: Cigarettes  ?  Quit date: 06/21/1995  ?  Years since quitting: 26.6  ? Smokeless tobacco: Never  ?Substance Use Topics  ? Alcohol use: No  ?  Alcohol/week: 0.0 standard drinks  ? Drug use: No  ? ? ?ROS: ?Constitutional:  Negative for fever, chills, weight loss ?CV: Negative for chest pain ?Respiratory:  Negative for shortness of breath, wheezing, sleep apnea, frequent cough ?GI:  Negative for nausea, vomiting, bloody stool, GERD ? ?PE: ?BP 124/74   Pulse 74   Ht 6\' 3"  (1.905 m)   Wt 142 lb (64.4 kg)   BMI 17.75 kg/m?  ?GENERAL APPEARANCE:  Well appearing, well developed, well nourished, NAD ?HEENT:  Atraumatic, normocephalic ?NECK:  Supple. Trachea midline ?ABDOMEN:  Soft, non-tender, no masses ?EXTREMITIES:  Moves all extremities well, without clubbing, cyanosis, or edema ?NEUROLOGIC:  Alert and oriented x 3, normal gait, CN II-XII grossly intact ?MENTAL STATUS:  appropriate ?BACK:  Non-tender to palpation, No CVAT ?SKIN:  Warm, dry, and intact ? ? ?Results: ?Laboratory Data: ?Lab Results  ?Component Value Date  ? WBC 9.8 12/25/2021  ? HGB 13.3 12/25/2021  ? HCT 40.6 12/25/2021  ? MCV 92.3 12/25/2021  ? PLT 185 12/25/2021  ? ? ?Lab Results  ?Component Value Date  ? CREATININE 0.92 11/23/2020  ? ? ? ?Urinalysis ?   ?Component Value Date/Time  ? [place] 11/23/2020 1435  ? APPEARANCEUR Cloudy (A) 12/04/2021 1806  ? LABSPEC 1.019 11/23/2020 1435  ? PHURINE 6.0 11/23/2020  1435  ? GLUCOSEU Negative 12/04/2021 1806  ? [place] NEGATIVE 11/23/2020 1435  ? BILIRUBINUR Negative 12/04/2021 1806  ? KETONESUR 5 (A) 11/23/2020 1435  ? PROTEINUR 2+ (A) 12/04/2021 1806  ? PROTEINUR NEGATIVE 11/23/2020 1435  ? NITRITE Positive (A) 12/04/2021 1806  ? NITRITE NEGATIVE 11/23/2020 1435  ? LEUKOCYTESUR 1+ (A) 12/04/2021 1806  ? LEUKOCYTESUR NEGATIVE 11/23/2020 1435  ? ? ?Lab Results  ?Component Value Date  ? LABMICR See below: 12/04/2021  ? WBCUA 11-30 (A) 12/04/2021  ? LABEPIT 0-10 12/04/2021  ? MUCUS Present 12/04/2021  ? BACTERIA Many (A) 12/04/2021  ? ? ?Pertinent Imaging: ? ?No results found for this or any previous visit. ? ?No results found for this or any previous visit. ? ?No results found for this or any previous visit. ? ?No results found  for this or any previous visit. ? ?No results found for this or any previous visit. ? ?No results found for this or any previous visit. ? ?No results found for this or any previous visit. ? ?Results for orders placed during the hospital encounter of 11/22/21 ? ?CT RENAL STONE STUDY ? ?Narrative ?CLINICAL DATA:  Frequent urinary tract infections. Urinary ?retention. History of prostate and lung cancer. ? ?EXAM: ?CT ABDOMEN AND PELVIS WITHOUT CONTRAST ? ?TECHNIQUE: ?Multidetector CT imaging of the abdomen and pelvis was performed ?following the standard protocol without IV contrast. ? ?RADIATION DOSE REDUCTION: This exam was performed according to the ?departmental dose-optimization program which includes automated ?exposure control, adjustment of the mA and/or kV according to ?patient size and/or use of iterative reconstruction technique. ? ?COMPARISON:  None. ? ?FINDINGS: ?Lower chest: Subpleural medial right middle lobe 1.5 cm nodular ?opacity (series 4/image 24), stable since 10/23/2020 chest CT ?angiogram. Similar 2.2 cm nodular opacity in the posterior right ?lower lobe (series 4/image 15), stable. Patchy subpleural ?reticulation in both lung bases,  unchanged. Mild cardiomegaly. ?Coronary atherosclerosis. ? ?Hepatobiliary: Normal liver size. No liver mass. Normal gallbladder ?with no radiopaque cholelithiasis. No biliary ductal dilatation. ? ?Pancreas: Norm

## 2022-01-21 ENCOUNTER — Telehealth: Payer: Self-pay

## 2022-01-21 NOTE — Telephone Encounter (Signed)
FYI patients wife wanted to let you know that they were flushing the sp tube because there was still a lot of sediment and they flushed a small blood clot through the tube.  There is no more blood draining and there doesn't seem to be anymore sediment at this time.  Also informed patient it was ok to hold the Hiprex until they finished the [place]. ?

## 2022-01-23 LAB — URINE CULTURE

## 2022-01-29 ENCOUNTER — Other Ambulatory Visit: Payer: Self-pay | Admitting: Urology

## 2022-01-29 ENCOUNTER — Telehealth: Payer: Self-pay

## 2022-01-29 ENCOUNTER — Encounter: Payer: Self-pay | Admitting: Urology

## 2022-01-29 MED ORDER — GENTAMICIN SULFATE 40 MG/ML IJ SOLN
80.0000 mg | INTRAMUSCULAR | 0 refills | Status: AC
Start: 2022-01-29 — End: 2022-02-03
  Filled 2022-01-31: qty 6, 3d supply, fill #0

## 2022-01-29 NOTE — Telephone Encounter (Signed)
Informed wife of results, she states that the patient is more confused now and is has more blood in his urine, no fever at this time.  Please advise.  ?

## 2022-01-29 NOTE — Telephone Encounter (Signed)
Patient daughter states that she checked with home health nurse and she can administer the IM antibiotic.  If IV antibiotics he would have to have a picc line placed.   ? ? ?

## 2022-01-29 NOTE — Telephone Encounter (Signed)
Daughter aware, nurse will give IM antibiotic.  ?

## 2022-01-29 NOTE — Telephone Encounter (Signed)
-----   Message from Primus Bravo, MD sent at 01/28/2022  9:04 AM EDT ----- ?Please let patient know that his urine culture shows evidence of a UTI.  Unfortunately, this is resistant to oral antibiotics.  Treatment would require IM or IV antibiotics.  I would only recommend this if he is symptomatic. ?

## 2022-01-30 ENCOUNTER — Encounter: Payer: Self-pay | Admitting: Urology

## 2022-01-30 ENCOUNTER — Other Ambulatory Visit (HOSPITAL_COMMUNITY): Payer: Self-pay

## 2022-01-30 ENCOUNTER — Other Ambulatory Visit: Payer: Self-pay

## 2022-01-31 ENCOUNTER — Other Ambulatory Visit (HOSPITAL_COMMUNITY): Payer: Self-pay

## 2022-02-06 ENCOUNTER — Ambulatory Visit (INDEPENDENT_AMBULATORY_CARE_PROVIDER_SITE_OTHER): Payer: No Typology Code available for payment source | Admitting: Urology

## 2022-02-06 ENCOUNTER — Encounter: Payer: Self-pay | Admitting: Urology

## 2022-02-06 VITALS — BP 128/83 | HR 77

## 2022-02-06 DIAGNOSIS — N390 Urinary tract infection, site not specified: Secondary | ICD-10-CM

## 2022-02-06 DIAGNOSIS — Z8546 Personal history of malignant neoplasm of prostate: Secondary | ICD-10-CM | POA: Diagnosis not present

## 2022-02-06 DIAGNOSIS — Z9359 Other cystostomy status: Secondary | ICD-10-CM

## 2022-02-06 NOTE — Progress Notes (Signed)
Assessment: 1. Chronic suprapubic catheter ([place])   2. Frequent UTI   3. History of prostate cancer     Plan: SP tube change today with a 16 French Foley catheter. Urine culture sent. Return to office in 1 month for SP tube change.  Chief Complaint:  Chief Complaint  Patient presents with   SP tube change    History of Present Illness:  Luke Quinn is a 88 y.o. year old male who is seen for further evaluation of urinary retention and recurrent UTIs.   He is status post radical retropubic prostatectomy in 2002 and had been followed by the [facility] in [place] for prostate cancer.  PSA from 1/22 was <0.010.  He has a history of urinary incontinence, previously requiring incontinence pads on a regular basis.  Cystoscopy from January 2017 showed a frozen bladder neck without evidence of stricture.  More recently, he has had problems with incomplete bladder emptying and recurrent UTIs.  He has been seen by urology at the [facility] clinic.  Bladder scan from June 2022 showed 262 mL in the bladder. Renal ultrasound from May 2022 showed no hydronephrosis, right renal cyst, nondistended bladder.  He has been treated for recurrent UTIs.  A Foley catheter was placed in August 2022.  This has been changed monthly by home health care.  He has continued to have frequent UTIs requiring antibiotic therapy.  Prior to his visit in 2/23, he had completed a course of amoxicillin and Levaquin for a UTI.    He has late stage Parkinson's disease, history of lung cancer, and is on chronic anticoagulation for atrial fibrillation.  Urine culture results: 2/23 >100 K Pseudomonas -treated with Omnicef 3/23 >100 K Morganella, Pseudomonas -treated with Omnicef and Levaquin 4/23 >100K Pseudomonas 5/23 >100K Pseudomonas - treated with Gentamicin IM x 3 days  CT imaging from 11/22/2021 showed punctate nonobstructing bilateral nephrolithiasis, several small stones in the bladder, no evidence of  obstruction. Cystoscopy from 4/23 demonstrated bladder trabeculations, tiny bladder calculi, post radical prostatectomy. He underwent placement of a suprapubic catheter by interventional radiology on 12/25/2021.  This was subsequently upsized to a 16 [place] Foley on 01/10/2022.  He presents today for suprapubic tube change. His catheter has been draining well.  He does have occasional gross hematuria.  He completed his gentamicin approximately 3 days ago.  He has not resumed the Hiprex.   Portions of the above documentation were copied from a prior visit for review purposes only.   Past Medical History:  Past Medical History:  Diagnosis Date   AAA (abdominal aortic aneurysm) ([place])    status post EVAR November 2016.   Atrial fibrillation [facility]) 10/2020   Feb 2022, started on Eliquis   CAD (coronary artery disease)    multiple cardiac stents placed years ago.   Cancer [facility])    prostate   COPD (chronic obstructive pulmonary disease) (HCC)    Depression    Hypertension    Lung cancer ([place])    right;  s/p SBRT treatment completed in November 2017   Macular degeneration, age related    Ocular hypertension    Parkinson's disease ([place])    Pulmonary embolism ([place])    Feb 2022, started on Eliquis   PVD (peripheral vascular disease) ([place])    Shingles     Past Surgical History:  Past Surgical History:  Procedure Laterality Date   cardiac stents     IR CATHETER TUBE CHANGE  01/10/2022   PROSTATE SURGERY  Allergies:  Allergies  Allergen Reactions   Gabapentin     Pt is unable to maintain his balance while taking this med    Atorvastatin     Ankle swelling    Family History:  Family History  Problem Relation Age of Onset   Diabetes Mother    Cancer Mother    Hypertension Mother    Diabetes Father     Social History:  Social History   Tobacco Use   Smoking status: Former    Types: Cigarettes    Quit date: 06/21/1995    Years since quitting: 26.6   Smokeless  tobacco: Never  Substance Use Topics   Alcohol use: No    Alcohol/week: 0.0 standard drinks   Drug use: No    ROS: Constitutional:  Negative for fever, chills, weight loss CV: Negative for chest pain, previous MI, hypertension Respiratory:  Negative for shortness of breath, wheezing, sleep apnea, frequent cough GI:  Negative for nausea, vomiting, bloody stool, GERD  Physical exam: GENERAL APPEARANCE:  Well appearing, well developed, well nourished, NAD HEENT:  Atraumatic, normocephalic, oropharynx clear NECK:  Supple without lymphadenopathy or thyromegaly ABDOMEN:  Soft, non-tender, no masses EXTREMITIES:  Moves all extremities well, without clubbing, cyanosis, or edema NEUROLOGIC:  Alert and oriented x 3, normal gait, CN II-XII grossly intact MENTAL STATUS:  appropriate BACK:  Non-tender to palpation, No CVAT SKIN:  Warm, dry, and intact   Results: None  Procedure:  Suprapubic catheter change  Suprapubic Tube Foley catheter change is accomplished under sterile conditions using a 16 Fr. catheter.  Ten ml of sterile water is left in the retention balloon.  There is the immediate return of 30 ml of urine.  Tolerated well without complications.  Foley irrigates quantitatively.  Foley catheter is left to gravity drainage. Sterile dressings applied.

## 2022-02-11 ENCOUNTER — Encounter: Payer: Self-pay | Admitting: Urology

## 2022-02-11 ENCOUNTER — Other Ambulatory Visit (HOSPITAL_COMMUNITY): Payer: Self-pay

## 2022-02-11 LAB — URINE CULTURE

## 2022-02-11 MED ORDER — GENTAMICIN SULFATE 40 MG/ML IJ SOLN
80.0000 mg | INTRAMUSCULAR | 0 refills | Status: AC
  Filled 2022-02-11: qty 6, 3d supply, fill #0

## 2022-02-11 NOTE — Telephone Encounter (Signed)
See below

## 2022-02-11 NOTE — Addendum Note (Signed)
Addended by: Primus Bravo on: 02/11/2022 01:26 PM   Modules accepted: Orders

## 2022-02-11 NOTE — Telephone Encounter (Signed)
Patient's wife called and made aware of rx sent and ID referral.

## 2022-02-17 ENCOUNTER — Other Ambulatory Visit: Payer: No Typology Code available for payment source

## 2022-02-17 ENCOUNTER — Other Ambulatory Visit: Payer: Self-pay

## 2022-02-17 DIAGNOSIS — N390 Urinary tract infection, site not specified: Secondary | ICD-10-CM

## 2022-02-17 NOTE — Progress Notes (Signed)
Urine culture only

## 2022-02-19 NOTE — Progress Notes (Deleted)
Luke Quinn for Infectious Disease  Reason for Consult:  Recurrent UTI  Referring Provider: Dr Felipa Eth   HPI:    Luke Quinn is a 88 y.o. male with PMHx as below who presents to the clinic for recurrent UTI.   Patient was referred by his urology office for evaluation of recurrent UTI.  He has a history of prostatectomy in 2002 and has been followed at the [place] in [place] previously per the notes from his urologist.  More recently, he has been following with Dr Felipa Eth in [place].  He has been having issues with incomplete bladder emptying and recurrent UTI's.  He had a Foley catheter that was placed in August 2022 that was changed monthly by home health.  He continued to have issues with retention and UTI's.  Thus, he underwent suprapubic catheter placement with IR on 12/25/21 followed by exchange and upsize on 01/10/22.  He has continued to follow up with urology regularly.  Following the procedure on 4/28, he was seen by the PA on 5/8 where his catheter was irrigated and urine sent for culture due having positive nitrites and leukocytes.  This was initially treated with Cefdinir.  However, cultures grew a MDR pseudomonas.  Patients wife stated he was more confused and had more blood in his urine (on Eliquis), therefore, patient was treated with Gentamicin IM x 3 doses.  Patient was seen again on 5/25 for a suprapubic catheter change.  Urine culture was sent again without UA.  This grew another MDR Pseudomonas and VRE.  Once again, he was treated with Gentamicin x 3 days as wife reported that he has been confused.  Making this more challenging is patient has a history of late stage Parkinson's disease and is on anticoagulation for his atrial fibrillation.    Patient's Medications  New Prescriptions   No medications on file  Previous Medications   ACETAMINOPHEN (TYLENOL) 500 MG TABLET    Take 500 mg by mouth 2 (two) times daily.   ALBUTEROL (PROVENTIL) (2.5 MG/3ML) 0.083%  NEBULIZER SOLUTION    Take 2.5 mg by nebulization every 6 (six) hours as needed for wheezing or shortness of breath.   APIXABAN (ELIQUIS) 5 MG TABS TABLET    Take 1 tablet (5 mg total) by mouth 2 (two) times daily.   CARBIDOPA-LEVODOPA (SINEMET CR) 50-200 MG TABLET    Take 1 tablet by mouth at bedtime.   CARBIDOPA-LEVODOPA (SINEMET IR) 25-100 MG TABLET    Take 2 tablets by mouth 5 (five) times daily.   CARBOXYMETHYLCELLULOSE 1 % OPHTHALMIC SOLUTION    Place 1 drop into both eyes 5 (five) times daily.   CHOLECALCIFEROL (VITAMIN D3) 25 MCG (1000 UNIT) TABLET    Take 1,000 Units by mouth daily.   COENZYME Q10 (COQ10) 100 MG CAPS    Take 100 mg by mouth daily.   DICLOFENAC SODIUM (VOLTAREN) 1 % GEL    Apply 1 application. topically 4 (four) times daily as needed (pain).   FAMOTIDINE (PEPCID) 20 MG TABLET    Take 20 mg by mouth daily.   FERROUS SULFATE 325 (65 FE) MG TABLET    Take 325 mg by mouth every Monday, Wednesday, and Friday.   FUROSEMIDE (LASIX) 20 MG TABLET    Take 10 mg by mouth daily.   GLUCOSAMINE CHONDROITIN MSM PO    Take 1 tablet by mouth daily.   GUAIFENESIN (HUMIBID E) 400 MG TABS TABLET    Take 400 mg by  mouth 3 (three) times daily.   HYDRALAZINE (APRESOLINE) 100 MG TABLET    Take 100 mg by mouth 2 (two) times daily.   LATANOPROST (XALATAN) 0.005 % OPHTHALMIC SOLUTION    Place 1 drop into both eyes at bedtime.   LIDOCAINE (LIDODERM) 5 %    Place 1 patch onto the skin daily as needed (back pain).   LIDOCAINE (LMX) 4 % CREAM    Apply 1 application. topically 3 (three) times daily as needed (back pain).   LOSARTAN (COZAAR) 100 MG TABLET    Take 100 mg by mouth daily.   MELATONIN 5 MG CAPS    Take 5 mg by mouth at bedtime.   METOPROLOL TARTRATE (LOPRESSOR) 50 MG TABLET    Take 25 mg by mouth 2 (two) times daily.   MULTIPLE VITAMINS-MINERALS (ICAPS AREDS 2 PO)    Take 1 capsule by mouth 2 (two) times daily.   NUTRITIONAL SUPPLEMENTS (ENSURE PO)    Take 1 Container by mouth in the  morning, at noon, in the evening, and at bedtime.   NYSTATIN CREAM (MYCOSTATIN)    Apply 1 application. topically daily.   OMEGA-3 FATTY ACIDS (FISH OIL) 1000 MG CAPS    Take 1,000 mg by mouth daily.   POLYETHYLENE GLYCOL (MIRALAX / GLYCOLAX) 17 G PACKET    Take 17 g by mouth daily.   POTASSIUM CHLORIDE SA (KLOR-CON M) 20 MEQ TABLET    Take 20 mEq by mouth 2 (two) times daily.   PRAZOSIN (MINIPRESS) 1 MG CAPSULE    Take 1 mg by mouth at bedtime.   RASAGILINE (AZILECT) 1 MG TABS TABLET    Take 1 tablet by mouth daily.   WHITE PETROLATUM-MINERAL OIL (LUBRICANT EYE) OINT    Place 1 application. into both eyes at bedtime.  Modified Medications   No medications on file  Discontinued Medications   No medications on file      Past Medical History:  Diagnosis Date   AAA (abdominal aortic aneurysm) ([place])    status post EVAR November 2016.   Atrial fibrillation [facility]) 10/2020   Feb 2022, started on Eliquis   CAD (coronary artery disease)    multiple cardiac stents placed years ago.   Cancer [facility])    prostate   COPD (chronic obstructive pulmonary disease) (HCC)    Depression    Hypertension    Lung cancer (HCC)    right;  s/p SBRT treatment completed in November 2017   Macular degeneration, age related    Ocular hypertension    Parkinson's disease ([place])    Pulmonary embolism ([place])    Feb 2022, started on Eliquis   PVD (peripheral vascular disease) ([place])    Shingles     Social History   Tobacco Use   Smoking status: Former    Types: Cigarettes    Quit date: 06/21/1995    Years since quitting: 26.6   Smokeless tobacco: Never  Substance Use Topics   Alcohol use: No    Alcohol/week: 0.0 standard drinks   Drug use: No    Family History  Problem Relation Age of Onset   Diabetes Mother    Cancer Mother    Hypertension Mother    Diabetes Father     Allergies  Allergen Reactions   Gabapentin     Pt is unable to maintain his balance while taking this med    Atorvastatin      Ankle swelling    ROS    OBJECTIVE:  There were no vitals filed for this visit.   There is no height or weight on file to calculate BMI.  Physical Exam   Labs and Microbiology:     Latest Ref Rng & Units 12/25/2021   11:46 AM 11/23/2020    2:20 PM 10/23/2020    3:18 PM  CBC  WBC 4.0 - 10.5 K/uL 9.8   26.5   6.7    Hemoglobin 13.0 - 17.0 g/dL 13.3   12.8   12.8    Hematocrit 39.0 - 52.0 % 40.6   41.9   42.1    Platelets 150 - 400 K/uL 185   198   247        Latest Ref Rng & Units 11/23/2020    4:29 PM 10/23/2020    3:18 PM 05/03/2020    7:52 PM  CMP  Glucose 70 - 99 mg/dL 133   95   104    BUN 8 - 23 mg/dL 18   20   34    Creatinine 0.61 - 1.24 mg/dL 0.92   0.82   1.18    Sodium 135 - 145 mmol/L 136   137   137    Potassium 3.5 - 5.1 mmol/L 3.3   3.4   3.5    Chloride 98 - 111 mmol/L 103   103   103    CO2 22 - 32 mmol/L 23   24   24     Calcium 8.9 - 10.3 mg/dL 9.9   10.1   10.4    Total Protein 6.5 - 8.1 g/dL 6.2   7.3     Total Bilirubin 0.3 - 1.2 mg/dL 1.0   0.8     Alkaline Phos 38 - 126 U/L 69   87     AST 15 - 41 U/L 20   28     ALT 0 - 44 U/L 11   11        No results found for this or any previous visit (from the past 240 hour(s)).  Imaging: ***   ASSESSMENT & PLAN:    No problem-specific Assessment & Plan notes found for this encounter.   No orders of the defined types were placed in this encounter.   This is a very complicated situation as it is really not clear to me whether or not he is even having true UTI or if simply his urinary tract is colonized with bacteria that are now multi drug resistant with no oral antibiotic options available. I suspect the latter.  His symptoms that his wife attributes to a UTI are somewhat vague with reported confusion and some hematuria.  He does not have any fevers, abdominal pain, CVA tenderness, or other symptoms.  It is not clear to me if some of this could be due to worsening Parkinsonism as well.  Discussed with  patient and wife that I have very little to offer in this situation.  He is at increased risk of UTI with his incomplete bladder emptying and presence of suprapubic catheter but his UA and cultures should be interpreted in conjunction with symptoms.  That is assuming they are not collected from the catheter or drainage bag to begin with.  Presence of VRE concerns me that this may have been collected from the drainage bag.   As mentioned, his reported confusion is not necessarily indicative of UTI in particular since in the past he has been treated empirically with antibiotics that would not be  effective against the bacteria isolated.  Recommend that he continue to follow up with urology as he is already doing.  I would only obtain UA and cultures if there is clinical concern for a UTI and would treat based upon the results but likely will only have IV or IM options available.  I also recommend continued follow up with neurology regarding his Parkinsonism symptoms.   ***  Raynelle Highland for Infectious Disease [facility] 02/19/2022, 12:47 PM

## 2022-02-20 ENCOUNTER — Ambulatory Visit: Payer: No Typology Code available for payment source | Admitting: Internal Medicine

## 2022-02-21 ENCOUNTER — Emergency Department (HOSPITAL_COMMUNITY)
Admission: EM | Admit: 2022-02-21 | Discharge: 2022-02-22 | Disposition: A | Discharge status: Home / Self Care | Payer: No Typology Code available for payment source | Attending: Emergency Medicine | Admitting: Emergency Medicine

## 2022-02-21 ENCOUNTER — Encounter (HOSPITAL_COMMUNITY): Payer: Self-pay | Admitting: Emergency Medicine

## 2022-02-21 ENCOUNTER — Telehealth: Payer: Self-pay

## 2022-02-21 ENCOUNTER — Emergency Department (HOSPITAL_COMMUNITY): Payer: No Typology Code available for payment source

## 2022-02-21 ENCOUNTER — Other Ambulatory Visit: Payer: Self-pay

## 2022-02-21 DIAGNOSIS — E876 Hypokalemia: Secondary | ICD-10-CM | POA: Insufficient documentation

## 2022-02-21 DIAGNOSIS — Z7901 Long term (current) use of anticoagulants: Secondary | ICD-10-CM | POA: Diagnosis not present

## 2022-02-21 DIAGNOSIS — R300 Dysuria: Secondary | ICD-10-CM | POA: Insufficient documentation

## 2022-02-21 LAB — CBC WITH DIFFERENTIAL/PLATELET
Abs Immature Granulocytes: 0.03 10*3/uL (ref 0.00–0.07)
Basophils Absolute: 0 10*3/uL (ref 0.0–0.1)
Basophils Relative: 1 %
Eosinophils Absolute: 0.2 10*3/uL (ref 0.0–0.5)
Eosinophils Relative: 2 %
HCT: 34.8 % — ABNORMAL LOW (ref 39.0–52.0)
Hemoglobin: 10.7 g/dL — ABNORMAL LOW (ref 13.0–17.0)
Immature Granulocytes: 0 %
Lymphocytes Relative: 10 %
Lymphs Abs: 0.7 10*3/uL (ref 0.7–4.0)
MCH: 28.5 pg (ref 26.0–34.0)
MCHC: 30.7 g/dL (ref 30.0–36.0)
MCV: 92.8 fL (ref 80.0–100.0)
Monocytes Absolute: 0.8 10*3/uL (ref 0.1–1.0)
Monocytes Relative: 11 %
Neutro Abs: 5.2 10*3/uL (ref 1.7–7.7)
Neutrophils Relative %: 76 %
Platelets: 217 10*3/uL (ref 150–400)
RBC: 3.75 MIL/uL — ABNORMAL LOW (ref 4.22–5.81)
RDW: 15.3 % (ref 11.5–15.5)
WBC: 6.9 10*3/uL (ref 4.0–10.5)
nRBC: 0 % (ref 0.0–0.2)

## 2022-02-21 LAB — BASIC METABOLIC PANEL
Anion gap: 4 — ABNORMAL LOW (ref 5–15)
BUN: 25 mg/dL — ABNORMAL HIGH (ref 8–23)
CO2: 25 mmol/L (ref 22–32)
Calcium: 9.8 mg/dL (ref 8.9–10.3)
Chloride: 112 mmol/L — ABNORMAL HIGH (ref 98–111)
Creatinine, Ser: 0.72 mg/dL (ref 0.61–1.24)
GFR, Estimated: >60 mL/min (ref >60)
Glucose, Bld: 119 mg/dL — ABNORMAL HIGH (ref 70–99)
Potassium: 3.1 mmol/L — ABNORMAL LOW (ref 3.5–5.1)
Sodium: 141 mmol/L (ref 135–145)

## 2022-02-21 LAB — URINE CULTURE

## 2022-02-21 MED ORDER — POTASSIUM CHLORIDE CRYS ER 20 MEQ PO TBCR
40.0000 meq | EXTENDED_RELEASE_TABLET | Freq: Once | ORAL | Status: AC
  Administered 2022-02-21: 40 meq via ORAL
  Filled 2022-02-21: qty 2

## 2022-02-21 NOTE — ED Provider Notes (Signed)
[facility] EMERGENCY DEPARTMENT Provider Note   CSN: [id number] Arrival date & time: 02/21/22  1654     History  Chief Complaint  Patient presents with   Dysuria    Luke Quinn is a 88 y.o. male.  Patient with a history of suprapubic catheter, presents with concern for urinary tract infection.  They state that he recently had urine culture done and had finished a course of IV gentamicin about a week ago.  He had repeat cultures ordered which continued to show Pseudomonas and VRE bacteria in the urine.  Patient otherwise has no other symptoms.  No fevers no cough no complaints of pain no vomiting or diarrhea.  They are concerned that there may be an underlying urinary tract infection and presents the patient back to the ER.       Home Medications Prior to Admission medications   Medication Sig Start Date End Date Taking? Authorizing Provider  acetaminophen (TYLENOL) 500 MG tablet Take 500 mg by mouth 2 (two) times daily.    [provider]  albuterol (PROVENTIL) (2.5 MG/3ML) 0.083% nebulizer solution Take 2.5 mg by nebulization every 6 (six) hours as needed for wheezing or shortness of breath.    [provider]  apixaban (ELIQUIS) 5 MG TABS tablet Take 1 tablet (5 mg total) by mouth 2 (two) times daily. 10/23/20   Daleen Bo, MD  carbidopa-levodopa (SINEMET CR) 50-200 MG tablet Take 1 tablet by mouth at bedtime.    [provider]  carbidopa-levodopa (SINEMET IR) 25-100 MG tablet Take 2 tablets by mouth 5 (five) times daily.    [provider]  carboxymethylcellulose 1 % ophthalmic solution Place 1 drop into both eyes 5 (five) times daily.    [provider]  cholecalciferol (VITAMIN D3) 25 MCG (1000 UNIT) tablet Take 1,000 Units by mouth daily.    [provider]  Coenzyme Q10 (COQ10) 100 MG CAPS Take 100 mg by mouth daily.    [provider]  diclofenac Sodium (VOLTAREN) 1 % GEL Apply 1 application. topically 4  (four) times daily as needed (pain).    [provider]  famotidine (PEPCID) 20 MG tablet Take 20 mg by mouth daily.    [provider]  ferrous sulfate 325 (65 FE) MG tablet Take 325 mg by mouth every Monday, Wednesday, and Friday.    [provider]  furosemide (LASIX) 20 MG tablet Take 10 mg by mouth daily.    [provider]  GLUCOSAMINE CHONDROITIN MSM PO Take 1 tablet by mouth daily.    [provider]  guaifenesin (HUMIBID E) 400 MG TABS tablet Take 400 mg by mouth 3 (three) times daily.    [provider]  hydrALAZINE (APRESOLINE) 100 MG tablet Take 100 mg by mouth 2 (two) times daily.    [provider]  latanoprost (XALATAN) 0.005 % ophthalmic solution Place 1 drop into both eyes at bedtime.    [provider]  lidocaine (LIDODERM) 5 % Place 1 patch onto the skin daily as needed (back pain).    [provider]  lidocaine (LMX) 4 % cream Apply 1 application. topically 3 (three) times daily as needed (back pain).    [provider]  losartan (COZAAR) 100 MG tablet Take 100 mg by mouth daily.    [provider]  Melatonin 5 MG CAPS Take 5 mg by mouth at bedtime.    [provider]  metoprolol tartrate (LOPRESSOR) 50 MG tablet Take 25  mg by mouth 2 (two) times daily.    [provider]  Multiple Vitamins-Minerals (ICAPS AREDS 2 PO) Take 1 capsule by mouth 2 (two) times daily.    [provider]  Nutritional Supplements (ENSURE PO) Take 1 Container by mouth in the morning, at noon, in the evening, and at bedtime.    [provider]  nystatin cream (MYCOSTATIN) Apply 1 application. topically daily.    [provider]  Omega-3 Fatty Acids (FISH OIL) 1000 MG CAPS Take 1,000 mg by mouth daily.    [provider]  polyethylene glycol (MIRALAX / GLYCOLAX) 17 g packet Take 17 g by mouth daily.    [provider]  potassium chloride SA  (KLOR-CON M) 20 MEQ tablet Take 20 mEq by mouth 2 (two) times daily.    [provider]  prazosin (MINIPRESS) 1 MG capsule Take 1 mg by mouth at bedtime.    [provider]  rasagiline (AZILECT) 1 MG TABS tablet Take 1 tablet by mouth daily.    [provider]  White Petrolatum-Mineral Oil (LUBRICANT EYE) OINT Place 1 application. into both eyes at bedtime.    [provider]      Allergies    Gabapentin and Atorvastatin    Review of Systems   Review of Systems  Unable to perform ROS: Dementia    Physical Exam Updated Vital Signs BP 101/72   Pulse 63   Temp 98.7 F (37.1 C) (Oral)   Resp 18   Ht 6\' 3"  (1.905 m)   Wt 65 kg   SpO2 96%   BMI 17.91 kg/m  Physical Exam Constitutional:      Appearance: He is well-developed.  HENT:     Head: Normocephalic.     Nose: Nose normal.  Eyes:     Extraocular Movements: Extraocular movements intact.  Cardiovascular:     Rate and Rhythm: Normal rate.  Pulmonary:     Effort: Pulmonary effort is normal.  Skin:    Coloration: Skin is not jaundiced.  Neurological:     Mental Status: He is alert. Mental status is at baseline.     ED Results / Procedures / Treatments   Labs (all labs ordered are listed, but only abnormal results are displayed) Labs Reviewed  CBC WITH DIFFERENTIAL/PLATELET - Abnormal; Notable for the following components:      Result Value   RBC 3.75 (*)    Hemoglobin 10.7 (*)    HCT 34.8 (*)    All other components within normal limits  BASIC METABOLIC PANEL - Abnormal; Notable for the following components:   Potassium 3.1 (*)    Chloride 112 (*)    Glucose, Bld 119 (*)    BUN 25 (*)    Anion gap 4 (*)    All other components within normal limits    EKG None  Radiology No results found.  Procedures Procedures    Medications Ordered in ED Medications  potassium chloride SA (KLOR-CON M) CR tablet 40 mEq (40 mEq Oral Given 02/21/22 1848)    ED Course/ Medical  Decision Making/ A&P                           Medical Decision Making Amount and/or Complexity of Data Reviewed Labs: ordered.  Risk Prescription drug management.   Today obtained from wife and daughter at bedside.  Chart review shows office visit in May 2023 for chronic suprapubic catheter.  Lengthy discussion with family regarding colonization versus infection.  Patient has no symptoms and is at his baseline mental status.  I doubt acute infection.  Labs are sent white count normal chemistry normal potassium slightly low and repleted here in the ER.  Joint decision made to forego antibiotics given colonization suspected.  Advised immediate return if he has fevers or change in mental status or any additional concerns, otherwise follow-up on outpatient basis with her urologist.  They reportedly have a infectious disease consultation awaiting which I feel would be appropriate.  Advised again immediate return for fevers or any additional concerns.        Final Clinical Impression(s) / ED Diagnoses Final diagnoses:  Hypokalemia    Rx / DC Orders ED Discharge Orders     None         Luna Fuse, MD 02/21/22 1859

## 2022-02-21 NOTE — ED Triage Notes (Signed)
Pt to ER via EMS from home.  Pt has hx of frequent UTI, has recently been treated with oral abx without improvement.  Pt has suprapubic catheter.  EMS administered 500cc NS.  Pt alert, nonverbal, unsure of pt baseline.

## 2022-02-21 NOTE — Telephone Encounter (Signed)
Wife called to inform you that they were unable to make the apt in [place] due to transportation issues.  He cannot be seen until Tuesday.  Home health nurse is concerned with his bloody urine and thinks he needs to be seen sooner.  Wife denies fever but she see's infection around the SP tube.  She is also concerned that his culture results are not back yet.  Please advise.

## 2022-02-21 NOTE — Discharge Instructions (Addendum)
Your blood work was normal your vital signs are normal.  I suspect colonization and not active infection of your urine.  Follow-up with your urologist this week as well as infectious disease as arranged.  However if you develop fevers or worsening symptoms or any additional concerns, return back to the ER.

## 2022-02-24 ENCOUNTER — Encounter: Payer: Self-pay | Admitting: Internal Medicine

## 2022-02-25 ENCOUNTER — Encounter: Payer: Self-pay | Admitting: Internal Medicine

## 2022-02-25 ENCOUNTER — Emergency Department (HOSPITAL_COMMUNITY)
Admission: EM | Admit: 2022-02-25 | Discharge: 2022-02-25 | Disposition: A | Discharge status: Home / Self Care | Payer: No Typology Code available for payment source | Attending: Emergency Medicine | Admitting: Emergency Medicine

## 2022-02-25 ENCOUNTER — Other Ambulatory Visit: Payer: Self-pay

## 2022-02-25 ENCOUNTER — Encounter (HOSPITAL_COMMUNITY): Payer: Self-pay

## 2022-02-25 ENCOUNTER — Emergency Department (HOSPITAL_COMMUNITY): Payer: No Typology Code available for payment source

## 2022-02-25 ENCOUNTER — Ambulatory Visit (INDEPENDENT_AMBULATORY_CARE_PROVIDER_SITE_OTHER): Payer: No Typology Code available for payment source | Admitting: Internal Medicine

## 2022-02-25 ENCOUNTER — Telehealth: Payer: Self-pay

## 2022-02-25 VITALS — BP 123/68 | HR 61 | Temp 98.5°F | Ht 75.0 in | Wt 135.0 lb

## 2022-02-25 DIAGNOSIS — Z978 Presence of other specified devices: Secondary | ICD-10-CM | POA: Diagnosis not present

## 2022-02-25 DIAGNOSIS — N390 Urinary tract infection, site not specified: Secondary | ICD-10-CM | POA: Diagnosis not present

## 2022-02-25 DIAGNOSIS — R079 Chest pain, unspecified: Secondary | ICD-10-CM | POA: Diagnosis present

## 2022-02-25 DIAGNOSIS — R0789 Other chest pain: Secondary | ICD-10-CM | POA: Diagnosis not present

## 2022-02-25 DIAGNOSIS — Z79899 Other long term (current) drug therapy: Secondary | ICD-10-CM | POA: Diagnosis not present

## 2022-02-25 DIAGNOSIS — G20 Parkinson's disease: Secondary | ICD-10-CM | POA: Diagnosis not present

## 2022-02-25 DIAGNOSIS — I10 Essential (primary) hypertension: Secondary | ICD-10-CM | POA: Diagnosis not present

## 2022-02-25 DIAGNOSIS — I2510 Atherosclerotic heart disease of native coronary artery without angina pectoris: Secondary | ICD-10-CM | POA: Diagnosis not present

## 2022-02-25 DIAGNOSIS — R339 Retention of urine, unspecified: Secondary | ICD-10-CM

## 2022-02-25 DIAGNOSIS — Z7901 Long term (current) use of anticoagulants: Secondary | ICD-10-CM | POA: Diagnosis not present

## 2022-02-25 DIAGNOSIS — G20A1 Parkinson's disease without dyskinesia, without mention of fluctuations: Secondary | ICD-10-CM

## 2022-02-25 LAB — CBC
HCT: 39.5 % (ref 39.0–52.0)
Hemoglobin: 12.2 g/dL — ABNORMAL LOW (ref 13.0–17.0)
MCH: 28.6 pg (ref 26.0–34.0)
MCHC: 30.9 g/dL (ref 30.0–36.0)
MCV: 92.5 fL (ref 80.0–100.0)
Platelets: 240 10*3/uL (ref 150–400)
RBC: 4.27 MIL/uL (ref 4.22–5.81)
RDW: 15.1 % (ref 11.5–15.5)
WBC: 7.2 10*3/uL (ref 4.0–10.5)
nRBC: 0 % (ref 0.0–0.2)

## 2022-02-25 LAB — TROPONIN I (HIGH SENSITIVITY)
Troponin I (High Sensitivity): 15 ng/L (ref <18)
Troponin I (High Sensitivity): 15 ng/L (ref <18)

## 2022-02-25 LAB — BASIC METABOLIC PANEL
Anion gap: 3 — ABNORMAL LOW (ref 5–15)
BUN: 22 mg/dL (ref 8–23)
CO2: 27 mmol/L (ref 22–32)
Calcium: 10.6 mg/dL — ABNORMAL HIGH (ref 8.9–10.3)
Chloride: 109 mmol/L (ref 98–111)
Creatinine, Ser: 0.94 mg/dL (ref 0.61–1.24)
GFR, Estimated: >60 mL/min (ref >60)
Glucose, Bld: 107 mg/dL — ABNORMAL HIGH (ref 70–99)
Potassium: 4 mmol/L (ref 3.5–5.1)
Sodium: 139 mmol/L (ref 135–145)

## 2022-02-25 NOTE — Progress Notes (Signed)
[name] for Infectious Disease  Reason for Consult:  Recurrent UTI  Referring Provider: Dr Felipa Eth   HPI:    Luke Quinn is a 88 y.o. male with PMHx as below who presents to the clinic for recurrent UTI in the setting of Parkinson's disease and suprapubic catheter.   Patient was referred by his urology office for evaluation of recurrent UTI.  He has a distant history of prostatectomy in 2002 and has been followed at the [facility] [place] previously per the notes from his urologist.  He continues to have home care through them.    He has been having issues with incomplete bladder emptying and recurrent UTI's and has also been following with Dr Felipa Eth in [place] for urology care as a result.  He had a Foley catheter that was placed in August 2022 that was changed monthly by home health.  He continued to have issues with retention and suspected UTI's.  Thus, he underwent suprapubic catheter placement with IR on 12/25/21 followed by exchange and upsize on 01/10/22.  He has continued to follow up with urology regularly.  Following the procedure on 4/28, he was seen by the PA on 5/8 where his catheter was irrigated and urine sent for culture due having positive nitrites and leukocytes.  This was initially treated with Cefdinir.  However, cultures grew a MDR pseudomonas so he was treated with Gentamicin IM x 3 doses.  Patient was seen again on 5/25 for a suprapubic catheter change.  Urine culture was sent without UA.  This grew another MDR Pseudomonas and VRE and patients wife states that she asked for the culture to ensure bacteria was eradicated.  Based on the result, he was treated with Gentamicin x 3 days for another round of antibiotics.  Patient was most recently seen in the ED on 6/9 at [facility] with concern for UTI based on the continued positive urine cultures.  The ED notes indicate there was no other symptoms, fevers, and labs were reassuring.  The ED provider discussed  with patient urinary tract colonization and patient was discharged home.  Patients wife states this was the first time she had heard about colonization.  Today, in addition to the positive urine cultures she is also concerned regarding an abrasion on his left scrotum that she noticed yesterday.  She has been cleaning this with Dial soap and providing Neosporin ointment.       Patient's Medications  New Prescriptions   No medications on file  Previous Medications   ACETAMINOPHEN (TYLENOL) 500 MG TABLET    Take 500 mg by mouth 2 (two) times daily.   ALBUTEROL (PROVENTIL) (2.5 MG/3ML) 0.083% NEBULIZER SOLUTION    Take 2.5 mg by nebulization every 6 (six) hours as needed for wheezing or shortness of breath.   APIXABAN (ELIQUIS) 5 MG TABS TABLET    Take 1 tablet (5 mg total) by mouth 2 (two) times daily.   CARBIDOPA-LEVODOPA (SINEMET CR) 50-200 MG TABLET    Take 1 tablet by mouth at bedtime.   CARBIDOPA-LEVODOPA (SINEMET IR) 25-100 MG TABLET    Take 2 tablets by mouth 5 (five) times daily.   CARBOXYMETHYLCELLULOSE 1 % OPHTHALMIC SOLUTION    Place 1 drop into both eyes 5 (five) times daily.   CHOLECALCIFEROL (VITAMIN D3) 25 MCG (1000 UNIT) TABLET    Take 1,000 Units by mouth daily.   COENZYME Q10 (COQ10) 100 MG CAPS    Take 100 mg by mouth  daily.   DICLOFENAC SODIUM (VOLTAREN) 1 % GEL    Apply 1 application. topically 4 (four) times daily as needed (pain).   FAMOTIDINE (PEPCID) 20 MG TABLET    Take 20 mg by mouth daily.   FERROUS SULFATE 325 (65 FE) MG TABLET    Take 325 mg by mouth every Monday, Wednesday, and Friday.   FUROSEMIDE (LASIX) 20 MG TABLET    Take 10 mg by mouth daily.   GLUCOSAMINE CHONDROITIN MSM PO    Take 1 tablet by mouth daily.   GUAIFENESIN (HUMIBID E) 400 MG TABS TABLET    Take 400 mg by mouth 3 (three) times daily.   HYDRALAZINE (APRESOLINE) 100 MG TABLET    Take 100 mg by mouth 2 (two) times daily.   LATANOPROST (XALATAN) 0.005 % OPHTHALMIC SOLUTION    Place 1 drop into both  eyes at bedtime.   LIDOCAINE (LIDODERM) 5 %    Place 1 patch onto the skin daily as needed (back pain).   LIDOCAINE (LMX) 4 % CREAM    Apply 1 application. topically 3 (three) times daily as needed (back pain).   LOSARTAN (COZAAR) 100 MG TABLET    Take 100 mg by mouth daily.   MELATONIN 5 MG CAPS    Take 5 mg by mouth at bedtime.   METOPROLOL TARTRATE (LOPRESSOR) 50 MG TABLET    Take 25 mg by mouth 2 (two) times daily.   MULTIPLE VITAMINS-MINERALS (ICAPS AREDS 2 PO)    Take 1 capsule by mouth 2 (two) times daily.   NUTRITIONAL SUPPLEMENTS (ENSURE PO)    Take 1 Container by mouth in the morning, at noon, in the evening, and at bedtime.   NYSTATIN CREAM (MYCOSTATIN)    Apply 1 application. topically daily.   OMEGA-3 FATTY ACIDS (FISH OIL) 1000 MG CAPS    Take 1,000 mg by mouth daily.   POLYETHYLENE GLYCOL (MIRALAX / GLYCOLAX) 17 G PACKET    Take 17 g by mouth daily.   POTASSIUM CHLORIDE SA (KLOR-CON M) 20 MEQ TABLET    Take 20 mEq by mouth 2 (two) times daily.   PRAZOSIN (MINIPRESS) 1 MG CAPSULE    Take 1 mg by mouth at bedtime.   RASAGILINE (AZILECT) 1 MG TABS TABLET    Take 1 tablet by mouth daily.   WHITE PETROLATUM-MINERAL OIL (LUBRICANT EYE) OINT    Place 1 application. into both eyes at bedtime.  Modified Medications   No medications on file  Discontinued Medications   No medications on file      Past Medical History:  Diagnosis Date   AAA (abdominal aortic aneurysm) ([place])    status post EVAR November 2016.   Atrial fibrillation [facility]) 10/2020   Feb 2022, started on Eliquis   CAD (coronary artery disease)    multiple cardiac stents placed years ago.   Cancer [facility])    prostate   COPD (chronic obstructive pulmonary disease) ([facility])    Depression    Hypertension    Lung cancer ([place])    right;  s/p SBRT treatment completed in November 2017   Macular degeneration, age related    Ocular hypertension    Parkinson's disease ([place])    Pulmonary embolism ([place])    Feb 2022, started on  Eliquis   PVD (peripheral vascular disease) ([place])    Shingles     Social History   Tobacco Use   Smoking status: Former    Types: Cigarettes    Quit date: 06/21/1995  Years since quitting: 26.7   Smokeless tobacco: Never  Substance Use Topics   Alcohol use: No    Alcohol/week: 0.0 standard drinks of alcohol   Drug use: No    Family History  Problem Relation Age of Onset   Diabetes Mother    Cancer Mother    Hypertension Mother    Diabetes Father     Allergies  Allergen Reactions   Gabapentin     Pt is unable to maintain his balance while taking this med    Atorvastatin     Ankle swelling    OBJECTIVE:    There were no vitals filed for this visit.   There is no height or weight on file to calculate BMI.  Physical Exam Constitutional:      General: He is not in acute distress.    Comments: Elderly man, lying in stretcher, NAD.   Eyes:     Extraocular Movements: Extraocular movements intact.     Conjunctiva/sclera: Conjunctivae normal.  Pulmonary:     Effort: Pulmonary effort is normal. No respiratory distress.  Abdominal:     General: There is no distension.     Palpations: Abdomen is soft.     Tenderness: There is no abdominal tenderness.  Genitourinary:    Comments: Suprapubic catheter in place.  Yellow urine in the catheter bag.  Linear superficial abrasion noted on left testicle.  No purulence, fluctuance or other concerning features.  Musculoskeletal:     Cervical back: Neck supple.     Right lower leg: No edema.     Left lower leg: No edema.  Skin:    General: Skin is warm and dry.  Neurological:     General: No focal deficit present.     Mental Status: He is alert. Mental status is at baseline.  Psychiatric:        Mood and Affect: Mood normal.        Behavior: Behavior normal.      Labs and Microbiology:     Latest Ref Rng & Units 02/21/2022    6:05 PM 12/25/2021   11:46 AM 11/23/2020    2:20 PM  CBC  WBC 4.0 - 10.5 K/uL 6.9  9.8   26.5   Hemoglobin 13.0 - 17.0 g/dL 10.7  13.3  12.8   Hematocrit 39.0 - 52.0 % 34.8  40.6  41.9   Platelets 150 - 400 K/uL 217  185  198       Latest Ref Rng & Units 02/21/2022    6:05 PM 11/23/2020    4:29 PM 10/23/2020    3:18 PM  CMP  Glucose 70 - 99 mg/dL 119  133  95   BUN 8 - 23 mg/dL 25  18  20    Creatinine 0.61 - 1.24 mg/dL 0.72  0.92  0.82   Sodium 135 - 145 mmol/L 141  136  137   Potassium 3.5 - 5.1 mmol/L 3.1  3.3  3.4   Chloride 98 - 111 mmol/L 112  103  103   CO2 22 - 32 mmol/L 25  23  24    Calcium 8.9 - 10.3 mg/dL 9.8  9.9  10.1   Total Protein 6.5 - 8.1 g/dL  6.2  7.3   Total Bilirubin 0.3 - 1.2 mg/dL  1.0  0.8   Alkaline Phos 38 - 126 U/L  69  87   AST 15 - 41 U/L  20  28   ALT 0 - 44 U/L  11  11      Recent Results (from the past 240 hour(s))  Urine Culture     Status: Abnormal   Collection Time: 02/17/22  3:27 PM   Specimen: Urine   Urine Catheter specime  Result Value Ref Range Status   Urine Culture, Routine Final report (A)  Final   Organism ID, Bacteria Comment (A)  Final    Comment: Pseudomonas aeruginosa Multi-Drug Resistant Organism Greater than 100,000 colony forming units per mL    ORGANISM ID, BACTERIA Comment (A)  Final    Comment: Vancomycin-resistant Enterococcus (Enterococcus faecium) 50,000-100,000 colony forming units per mL    Antimicrobial Susceptibility Comment  Final    Comment:       ** S = Susceptible; I = Intermediate; R = Resistant **                    P = Positive; N = Negative             MICS are expressed in micrograms per mL    Antibiotic                 RSLT#1    RSLT#2    RSLT#3    RSLT#4 Amikacin                       S Cefepime                       I Ceftazidime                    S Ciprofloxacin                  R         R Gentamicin                     I Imipenem                       S Levofloxacin                   R         R Meropenem                      S Nitrofurantoin                            R Penicillin                               R Piperacillin                   S Quinupristin/Dalfopristin                S Tetracycline                             R Ticarcillin                    S Tobramycin                     S Vancomycin  R      ASSESSMENT & PLAN:    1. Frequent UTI  2. Chronic indwelling Foley catheter  3. Incomplete bladder emptying  4. Parkinson's disease ([place])  This is a complex situation for patient and his wife as they have sought to eradicate the urine of bacteria.  However, we discussed urinary tract colonization, especially in the presence of a catheter and that antibiotics are not necessary in this setting.  Additionally, we discussed that unfortunately he has a multi drug resistant organism with no oral antibiotic options available.  He does not have any fevers, abdominal pain, CVA tenderness, or other symptoms that would be attributable to UTI.  She did have some concern regarding bleeding but suspect this may be due to being on Eliquis and perhaps some irritation from the catheter but was not indicative of an infection per se.    He is at increased risk of UTI with his incomplete bladder emptying and presence of suprapubic catheter but his UA and cultures should be interpreted in conjunction with symptoms as it is not uncommon to have pyuria and bacteruria particularly with an indwelling catheter.    Recommend that he continue to follow up with urology as he is already doing.  I would obtain UA and cultures if there is clinical concern for a UTI and would treat based upon the results but likely will only have IV or IM options available.  I am happy to see him as needed again or provide antibiotic recommendations when necessary.    Raynelle [facility] for Infectious Disease [facility] 02/25/2022, 1:00 PM   I have personally spent 60 minutes involved in face-to-face and non-face-to-face  activities for this patient on the day of the visit. Professional time spent includes the following activities: Preparing to see the patient (review of tests), Obtaining and/or reviewing separately obtained history (admission/discharge record), Performing a medically appropriate examination and/or evaluation , Ordering medications/tests/procedures, referring and communicating with other health care professionals, Documenting clinical information in the EMR, Independently interpreting results (not separately reported), Communicating results to the patient/family/caregiver, Counseling and educating the patient/family/caregiver and Care coordination (not separately reported).

## 2022-02-25 NOTE — ED Notes (Signed)
[facility] notified of patient needing transportation back home. 

## 2022-02-25 NOTE — ED Triage Notes (Addendum)
Pt BIB [facility]. Pt has a-fib at baseline. During a routine transport pt started having chest pain and a "worsening of his irregular pulse."   Transport states pt was taken to infectious disease clinic for an appointment related to a UTI. Transport states that pt is alert and confused at baseline.   Pt denies CP on arrival.

## 2022-02-25 NOTE — Telephone Encounter (Signed)
Returned call and wife just wanted to give an FYI that the patient did not receive IV antibiotics at the ER but they did make it to infectious disease apt.

## 2022-02-25 NOTE — ED Provider Notes (Signed)
[name] EMERGENCY DEPARTMENT Provider Note   CSN: [id number] Arrival date & time: 02/25/22  1752     History  Chief Complaint  Patient presents with   Chest Pain    Luke Quinn is a 88 y.o. male who has a history of CAD, hypertension, advanced Parkinson's disease causing him to be bedbound, frequent UTIs with an indwelling Foley catheter, was actually being transported home from his appointment with infectious disease at the recommendation of his urologist who developed a fleeting episode of chest pain prior to being moved back into his home.  He states it felt like his heart was "not in his chest", otherwise was unable to qualify or describe the pain.  It was not accompanied by shortness of breath.  He states it lasted for around 20 minutes but was resolved upon arrival here.  Wife at the bedside states that one of the transporters accidentally stepped on his left hand at some point during this transfer.  It is unclear if the pain occurred directly after the injury to his left hand.  He denies any current symptoms of pain in the hand however.  The history is provided by the patient and a relative.       Home Medications Prior to Admission medications   Medication Sig Start Date End Date Taking? Authorizing Provider  acetaminophen (TYLENOL) 500 MG tablet Take 500 mg by mouth 2 (two) times daily.   Yes [provider]  albuterol (PROVENTIL) (2.5 MG/3ML) 0.083% nebulizer solution Take 2.5 mg by nebulization every 6 (six) hours as needed for wheezing or shortness of breath.   Yes [provider]  apixaban (ELIQUIS) 5 MG TABS tablet Take 1 tablet (5 mg total) by mouth 2 (two) times daily. 10/23/20  Yes Daleen Bo, MD  carbidopa-levodopa (SINEMET CR) 50-200 MG tablet Take 1 tablet by mouth at bedtime.   Yes [provider]  carbidopa-levodopa (SINEMET IR) 25-100 MG tablet Take 2 tablets by mouth 5 (five) times daily.   Yes [provider]   carboxymethylcellulose 1 % ophthalmic solution Place 1 drop into both eyes 5 (five) times daily.   Yes [provider]  cholecalciferol (VITAMIN D3) 25 MCG (1000 UNIT) tablet Take 1,000 Units by mouth daily.   Yes [provider]  Coenzyme Q10 (COQ10) 100 MG CAPS Take 100 mg by mouth daily.   Yes [provider]  diclofenac Sodium (VOLTAREN) 1 % GEL Apply 1 application. topically 4 (four) times daily as needed (pain).   Yes [provider]  famotidine (PEPCID) 20 MG tablet Take 20 mg by mouth daily.   Yes [provider]  ferrous sulfate 325 (65 FE) MG tablet Take 325 mg by mouth every Monday, Wednesday, and Friday.   Yes [provider]  furosemide (LASIX) 20 MG tablet Take 10 mg by mouth daily.   Yes [provider]  GLUCOSAMINE CHONDROITIN MSM PO Take 1 tablet by mouth 2 times daily at 12 noon and 4 pm.   Yes [provider]  guaifenesin (HUMIBID E) 400 MG TABS tablet Take 400 mg by mouth 3 (three) times daily.   Yes [provider]  hydrALAZINE (APRESOLINE) 100 MG tablet Take 100 mg by mouth 2 (two) times daily.   Yes [provider]  latanoprost (XALATAN) 0.005 % ophthalmic solution Place 1 drop into both eyes at bedtime.   Yes [provider]  lidocaine (LMX) 4 % cream Apply 1 application. topically 3 (three) times daily  as needed (back pain).   Yes [provider]  losartan (COZAAR) 100 MG tablet Take 100 mg by mouth daily.   Yes [provider]  Melatonin 5 MG CAPS Take 5 mg by mouth at bedtime.   Yes [provider]  metoprolol tartrate (LOPRESSOR) 50 MG tablet Take 25 mg by mouth 2 (two) times daily.   Yes [provider]  Multiple Vitamins-Minerals (ICAPS AREDS 2 PO) Take 1 capsule by mouth 2 (two) times daily.   Yes [provider]  Nutritional Supplements (ENSURE PO) Take 1 Container by mouth in the morning, at noon, in the evening, and at  bedtime.   Yes [provider]  nystatin cream (MYCOSTATIN) Apply 1 application. topically daily.   Yes [provider]  Omega-3 Fatty Acids (FISH OIL) 1000 MG CAPS Take 1,000 mg by mouth daily.   Yes [provider]  polyethylene glycol (MIRALAX / GLYCOLAX) 17 g packet Take 17 g by mouth daily.   Yes [provider]  potassium chloride SA (KLOR-CON M) 20 MEQ tablet Take 20 mEq by mouth 2 (two) times daily.   Yes [provider]  prazosin (MINIPRESS) 1 MG capsule Take 1 mg by mouth at bedtime.   Yes [provider]  rasagiline (AZILECT) 1 MG TABS tablet Take 1 tablet by mouth daily.   Yes [provider]  White Petrolatum-Mineral Oil (LUBRICANT EYE) OINT Place 1 application. into both eyes at bedtime.   Yes [provider]  lidocaine (LIDODERM) 5 % Place 1 patch onto the skin daily as needed (back pain).    [provider]      Allergies    Gabapentin and Atorvastatin    Review of Systems   Review of Systems  Constitutional:  Negative for fever.  HENT:  Negative for congestion and sore throat.   Eyes: Negative.   Respiratory:  Negative for chest tightness and shortness of breath.   Cardiovascular:  Positive for chest pain.  Gastrointestinal:  Negative for abdominal pain and nausea.  Genitourinary: Negative.   Musculoskeletal:  Negative for arthralgias, joint swelling and neck pain.  Skin:  Positive for color change. Negative for rash and wound.       Small bruise base of left ring finger.    Neurological:  Negative for dizziness, weakness, light-headedness, numbness and headaches.  Psychiatric/Behavioral: Negative.      Physical Exam Updated Vital Signs BP 130/89   Pulse 80   Temp 98.6 F (37 C) (Oral)   Resp (!) 21   SpO2 96%  Physical Exam Vitals and nursing note reviewed.  Constitutional:      Appearance: He is well-developed.  HENT:     Head: Normocephalic and atraumatic.  Eyes:      Conjunctiva/sclera: Conjunctivae normal.  Cardiovascular:     Rate and Rhythm: Normal rate and regular rhythm.     Heart sounds: Normal heart sounds.  Pulmonary:     Effort: Pulmonary effort is normal.     Breath sounds: Normal breath sounds. No wheezing.  Abdominal:     General: Bowel sounds are normal.     Palpations: Abdomen is soft.     Tenderness: There is no abdominal tenderness.  Musculoskeletal:        General: Normal range of motion.     Cervical back: Normal range of motion.  Skin:    General: Skin is warm and dry.     Findings: Ecchymosis present.     Comments:  Small bruise left proximal ring finger.  Pt has FROM of the digits and left hand without deficit or pain.   Neurological:     Mental Status: He is alert.     ED Results / Procedures / Treatments   Labs (all labs ordered are listed, but only abnormal results are displayed) Labs Reviewed  BASIC METABOLIC PANEL - Abnormal; Notable for the following components:      Result Value   Glucose, Bld 107 (*)    Calcium 10.6 (*)    Anion gap 3 (*)    All other components within normal limits  CBC - Abnormal; Notable for the following components:   Hemoglobin 12.2 (*)    All other components within normal limits  TROPONIN I (HIGH SENSITIVITY)  TROPONIN I (HIGH SENSITIVITY)    EKG EKG Interpretation  Date/Time:  Tuesday February 25 2022 18:07:06 EDT Ventricular Rate:  78 PR Interval:  142 QRS Duration: 103 QT Interval:  363 QTC Calculation: 414 R Axis:   207 Text Interpretation: Sinus or ectopic atrial rhythm Paired ventricular premature complexes Confirmed by Octaviano Glow [phone]) on 02/25/2022 6:21:57 PM  Radiology DG Chest Port 1 View  Result Date: 02/25/2022 CLINICAL DATA:  Atrial fibrillation with chest pain, history of Parkinson's. EXAM: PORTABLE CHEST 1 VIEW COMPARISON:  February 21, 2022. FINDINGS: Image rotated the LEFT, limited by patient positioning related to patient condition by report. EKG leads  project over the chest. No lobar consolidation. Stable scarring in the RIGHT upper lobe. No sign of pleural effusion or pneumothorax. Osteopenia without acute skeletal process. IMPRESSION: No active cardiopulmonary disease. Electronically Signed   By: Zetta Bills M.D.   On: 02/25/2022 19:07    Procedures Procedures    Medications Ordered in ED Medications - No data to display  ED Course/ Medical Decision Making/ A&P                           Medical Decision Making Patient presenting with a 20-minute episode of chest pain which resolved spontaneously prior to arrival, occurred while he was being transported home from an MD visit.  Patient is bedbound secondary to advanced Parkinson's disease.  His symptoms were not associated with shortness of breath, diaphoresis, nausea, vomiting.  Resolved spontaneously.  He does have a history of atrial fibrillation, there is concern for worsening of his irregular pulse, however he has remained stable here as he has been maintained on monitor without significant ectopy, however he is in A-fib, rate controlled.  He was felt stable for discharge home, has been asymptomatic here.  Wife at bedside states he does have a cardiologist with the [facility], last checkup was 3 months ago, he sees his cardiologist yearly.  Advised  follow-up with cardiology for any return of symptoms, return precautions also outlined.  Amount and/or Complexity of Data Reviewed Labs: ordered.    Details: Delta troponins are negative, electrolytes are normal, Radiology: ordered and independent interpretation performed.    Details: No active cardiopulmonary disease. ECG/medicine tests: ordered.    Details: Rate controlled A-fib.  PVCs.           Final Clinical Impression(s) / ED Diagnoses Final diagnoses:  Atypical chest pain    Rx / DC Orders ED Discharge Orders     None         Landis Martins 02/25/22 2251    Wyvonnia Dusky, MD 02/25/22 2310

## 2022-02-25 NOTE — Telephone Encounter (Signed)
Patient's wife left a voice message 02-21-22:  Going to ER to have infection cleared up at Dr. Felipa Eth suggested.  Wanting a call back at [phone].  Thanks, Helene Kelp

## 2022-03-06 ENCOUNTER — Ambulatory Visit: Payer: No Typology Code available for payment source | Admitting: Urology

## 2022-03-11 ENCOUNTER — Ambulatory Visit: Payer: No Typology Code available for payment source | Admitting: Urology

## 2022-03-11 ENCOUNTER — Encounter: Payer: Self-pay | Admitting: Urology

## 2022-03-11 ENCOUNTER — Ambulatory Visit (INDEPENDENT_AMBULATORY_CARE_PROVIDER_SITE_OTHER): Payer: No Typology Code available for payment source | Admitting: Urology

## 2022-03-11 DIAGNOSIS — N390 Urinary tract infection, site not specified: Secondary | ICD-10-CM

## 2022-03-11 DIAGNOSIS — R339 Retention of urine, unspecified: Secondary | ICD-10-CM

## 2022-03-11 DIAGNOSIS — Z8546 Personal history of malignant neoplasm of prostate: Secondary | ICD-10-CM

## 2022-03-11 DIAGNOSIS — Z9359 Other cystostomy status: Secondary | ICD-10-CM | POA: Diagnosis not present

## 2022-03-11 NOTE — Progress Notes (Signed)
Assessment: 1. Chronic suprapubic catheter (HCC)   2. History of prostate cancer   3. Frequent UTI     Plan: SP tube change today with a 16 French Foley catheter. Return to office in 1 month for SP tube change.  Chief Complaint:  Chief Complaint  Patient presents with   SP tube change    History of Present Illness:  Luke Quinn is a 88 y.o. year old male who is seen for further evaluation of urinary retention and recurrent UTIs.   He is status post radical retropubic prostatectomy in 2002 and had been followed by the [facility] in [place] for prostate cancer.  PSA from 1/22 was <0.010.  He has a history of urinary incontinence, previously requiring incontinence pads on a regular basis.  Cystoscopy from January 2017 showed a frozen bladder neck without evidence of stricture.  More recently, he has had problems with incomplete bladder emptying and recurrent UTIs.  He has been seen by urology at the [facility] clinic.  Bladder scan from June 2022 showed 262 mL in the bladder. Renal ultrasound from May 2022 showed no hydronephrosis, right renal cyst, nondistended bladder.  He has been treated for recurrent UTIs.  A Foley catheter was placed in August 2022.  This has been changed monthly by home health care.  He has continued to have frequent UTIs requiring antibiotic therapy.  Prior to his visit in 2/23, he had completed a course of amoxicillin and Levaquin for a UTI.    He has late stage Parkinson's disease, history of lung cancer, and is on chronic anticoagulation for atrial fibrillation.  Urine culture results: 2/23 >100 K Pseudomonas -treated with Omnicef 3/23 >100 K [place], Pseudomonas -treated with Omnicef and Levaquin 4/23 >100K Pseudomonas 5/23 >100K Pseudomonas - treated with Gentamicin IM x 3 days 6/23 >100K Pseudomonas - treated with Gentamicin x 3 days  CT imaging from 11/22/2021 showed punctate nonobstructing bilateral nephrolithiasis, several small stones  in the bladder, no evidence of obstruction. Cystoscopy from 4/23 demonstrated bladder trabeculations, tiny bladder calculi, post radical prostatectomy. He underwent placement of a suprapubic catheter by interventional radiology on 12/25/2021.  This was subsequently upsized to a 16 [place] Foley on 01/10/2022. His SP tube was changed on 02/06/22.  He has seen Dr. Earlene Plater with ID.    He presents today for suprapubic tube change. His catheter is draining well.  No significant sediment.  No gross hematuria.  No recent fevers or chills.  Portions of the above documentation were copied from a prior visit for review purposes only.   Past Medical History:  Past Medical History:  Diagnosis Date   AAA (abdominal aortic aneurysm) (HCC)    status post EVAR November 2016.   Atrial fibrillation [facility]) 10/2020   Feb 2022, started on Eliquis   CAD (coronary artery disease)    multiple cardiac stents placed years ago.   Cancer [facility])    prostate   COPD (chronic obstructive pulmonary disease) (HCC)    Depression    Hypertension    Lung cancer (HCC)    right;  s/p SBRT treatment completed in November 2017   Macular degeneration, age related    Ocular hypertension    Parkinson's disease (HCC)    Pulmonary embolism (HCC)    Feb 2022, started on Eliquis   PVD (peripheral vascular disease) (HCC)    Shingles     Past Surgical History:  Past Surgical History:  Procedure Laterality Date   cardiac stents  IR CATHETER TUBE CHANGE  01/10/2022   PROSTATE SURGERY      Allergies:  Allergies  Allergen Reactions   Gabapentin     Pt is unable to maintain his balance while taking this med    Atorvastatin     Ankle swelling    Family History:  Family History  Problem Relation Age of Onset   Diabetes Mother    Cancer Mother    Hypertension Mother    Diabetes Father     Social History:  Social History   Tobacco Use   Smoking status: Former    Types: Cigarettes    Quit date: 06/21/1995     Years since quitting: 26.7   Smokeless tobacco: Never  Vaping Use   Vaping Use: Never used  Substance Use Topics   Alcohol use: No    Alcohol/week: 0.0 standard drinks of alcohol   Drug use: No    ROS: Constitutional:  Negative for fever, chills, weight loss CV: Negative for chest pain, previous MI, hypertension Respiratory:  Negative for shortness of breath, wheezing, sleep apnea, frequent cough GI:  Negative for nausea, vomiting, bloody stool, GERD  Physical exam: GENERAL APPEARANCE:  Well appearing, well developed, well nourished, NAD HEENT:  Atraumatic, normocephalic, oropharynx clear NECK:  Supple without lymphadenopathy or thyromegaly ABDOMEN:  Soft, non-tender, no masses; SP tube site intact EXTREMITIES: Without clubbing, cyanosis, or edema NEUROLOGIC:  Alert and oriented x 3, on stretcher, CN II-XII grossly intact MENTAL STATUS:  appropriate BACK:  Non-tender to palpation, No CVAT SKIN:  Warm, dry, and intact   Results: None  Procedure:  Suprapubic catheter change  Suprapubic Tube Foley catheter change is accomplished under sterile conditions using a 16 Fr. catheter.  Ten ml of sterile water is left in the retention balloon.  There is the immediate return of 30 ml of urine.  Tolerated well without complications.  Foley irrigates quantitatively.  Foley catheter is left to gravity drainage. Sterile dressings applied.

## 2022-03-25 ENCOUNTER — Other Ambulatory Visit: Payer: Self-pay

## 2022-03-25 ENCOUNTER — Emergency Department (HOSPITAL_COMMUNITY): Payer: No Typology Code available for payment source

## 2022-03-25 ENCOUNTER — Encounter (HOSPITAL_COMMUNITY): Payer: Self-pay | Admitting: Internal Medicine

## 2022-03-25 ENCOUNTER — Inpatient Hospital Stay (HOSPITAL_COMMUNITY)
Admission: EM | Admit: 2022-03-25 | Discharge: 2022-03-27 | DRG: 193 | Disposition: A | Discharge status: Home Health | Payer: No Typology Code available for payment source | Attending: Internal Medicine | Admitting: Internal Medicine

## 2022-03-25 ENCOUNTER — Inpatient Hospital Stay (HOSPITAL_COMMUNITY): Payer: No Typology Code available for payment source

## 2022-03-25 DIAGNOSIS — G9341 Metabolic encephalopathy: Secondary | ICD-10-CM | POA: Diagnosis present

## 2022-03-25 DIAGNOSIS — Z20822 Contact with and (suspected) exposure to covid-19: Secondary | ICD-10-CM | POA: Diagnosis present

## 2022-03-25 DIAGNOSIS — I2510 Atherosclerotic heart disease of native coronary artery without angina pectoris: Secondary | ICD-10-CM | POA: Diagnosis present

## 2022-03-25 DIAGNOSIS — Z8249 Family history of ischemic heart disease and other diseases of the circulatory system: Secondary | ICD-10-CM

## 2022-03-25 DIAGNOSIS — I10 Essential (primary) hypertension: Secondary | ICD-10-CM | POA: Diagnosis present

## 2022-03-25 DIAGNOSIS — R1310 Dysphagia, unspecified: Secondary | ICD-10-CM | POA: Diagnosis present

## 2022-03-25 DIAGNOSIS — Z87891 Personal history of nicotine dependence: Secondary | ICD-10-CM

## 2022-03-25 DIAGNOSIS — Z9079 Acquired absence of other genital organ(s): Secondary | ICD-10-CM

## 2022-03-25 DIAGNOSIS — Z66 Do not resuscitate: Secondary | ICD-10-CM | POA: Diagnosis present

## 2022-03-25 DIAGNOSIS — Z8546 Personal history of malignant neoplasm of prostate: Secondary | ICD-10-CM

## 2022-03-25 DIAGNOSIS — Z833 Family history of diabetes mellitus: Secondary | ICD-10-CM

## 2022-03-25 DIAGNOSIS — J440 Chronic obstructive pulmonary disease with acute lower respiratory infection: Secondary | ICD-10-CM | POA: Diagnosis present

## 2022-03-25 DIAGNOSIS — R64 Cachexia: Secondary | ICD-10-CM | POA: Diagnosis present

## 2022-03-25 DIAGNOSIS — Z7401 Bed confinement status: Secondary | ICD-10-CM

## 2022-03-25 DIAGNOSIS — J189 Pneumonia, unspecified organism: Secondary | ICD-10-CM | POA: Diagnosis not present

## 2022-03-25 DIAGNOSIS — Z978 Presence of other specified devices: Secondary | ICD-10-CM | POA: Diagnosis not present

## 2022-03-25 DIAGNOSIS — Z955 Presence of coronary angioplasty implant and graft: Secondary | ICD-10-CM

## 2022-03-25 DIAGNOSIS — Z85118 Personal history of other malignant neoplasm of bronchus and lung: Secondary | ICD-10-CM | POA: Diagnosis not present

## 2022-03-25 DIAGNOSIS — Z86711 Personal history of pulmonary embolism: Secondary | ICD-10-CM | POA: Diagnosis not present

## 2022-03-25 DIAGNOSIS — Z681 Body mass index (BMI) 19 or less, adult: Secondary | ICD-10-CM

## 2022-03-25 DIAGNOSIS — Z888 Allergy status to other drugs, medicaments and biological substances status: Secondary | ICD-10-CM

## 2022-03-25 DIAGNOSIS — Z8679 Personal history of other diseases of the circulatory system: Secondary | ICD-10-CM

## 2022-03-25 DIAGNOSIS — G20 Parkinson's disease: Secondary | ICD-10-CM | POA: Diagnosis present

## 2022-03-25 DIAGNOSIS — I482 Chronic atrial fibrillation, unspecified: Secondary | ICD-10-CM | POA: Diagnosis present

## 2022-03-25 DIAGNOSIS — Z87440 Personal history of urinary (tract) infections: Secondary | ICD-10-CM | POA: Diagnosis not present

## 2022-03-25 DIAGNOSIS — J449 Chronic obstructive pulmonary disease, unspecified: Secondary | ICD-10-CM | POA: Diagnosis not present

## 2022-03-25 DIAGNOSIS — I739 Peripheral vascular disease, unspecified: Secondary | ICD-10-CM | POA: Diagnosis present

## 2022-03-25 DIAGNOSIS — Z9689 Presence of other specified functional implants: Secondary | ICD-10-CM | POA: Diagnosis present

## 2022-03-25 DIAGNOSIS — Z7901 Long term (current) use of anticoagulants: Secondary | ICD-10-CM

## 2022-03-25 DIAGNOSIS — I4891 Unspecified atrial fibrillation: Secondary | ICD-10-CM | POA: Diagnosis not present

## 2022-03-25 DIAGNOSIS — Z79899 Other long term (current) drug therapy: Secondary | ICD-10-CM

## 2022-03-25 LAB — COMPREHENSIVE METABOLIC PANEL
ALT: 7 U/L (ref 0–44)
AST: 12 U/L — ABNORMAL LOW (ref 15–41)
Albumin: 3.1 g/dL — ABNORMAL LOW (ref 3.5–5.0)
Alkaline Phosphatase: 87 U/L (ref 38–126)
Anion gap: 6 (ref 5–15)
BUN: 23 mg/dL (ref 8–23)
CO2: 25 mmol/L (ref 22–32)
Calcium: 11.1 mg/dL — ABNORMAL HIGH (ref 8.9–10.3)
Chloride: 113 mmol/L — ABNORMAL HIGH (ref 98–111)
Creatinine, Ser: 0.73 mg/dL (ref 0.61–1.24)
GFR, Estimated: >60 mL/min (ref >60)
Glucose, Bld: 126 mg/dL — ABNORMAL HIGH (ref 70–99)
Potassium: 3.5 mmol/L (ref 3.5–5.1)
Sodium: 144 mmol/L (ref 135–145)
Total Bilirubin: 0.9 mg/dL (ref 0.3–1.2)
Total Protein: 6.4 g/dL — ABNORMAL LOW (ref 6.5–8.1)

## 2022-03-25 LAB — CBC WITH DIFFERENTIAL/PLATELET
Abs Immature Granulocytes: 0.07 10*3/uL (ref 0.00–0.07)
Basophils Absolute: 0 10*3/uL (ref 0.0–0.1)
Basophils Relative: 0 %
Eosinophils Absolute: 0 10*3/uL (ref 0.0–0.5)
Eosinophils Relative: 0 %
HCT: 35.4 % — ABNORMAL LOW (ref 39.0–52.0)
Hemoglobin: 11 g/dL — ABNORMAL LOW (ref 13.0–17.0)
Immature Granulocytes: 1 %
Lymphocytes Relative: 3 %
Lymphs Abs: 0.4 10*3/uL — ABNORMAL LOW (ref 0.7–4.0)
MCH: 28.6 pg (ref 26.0–34.0)
MCHC: 31.1 g/dL (ref 30.0–36.0)
MCV: 91.9 fL (ref 80.0–100.0)
Monocytes Absolute: 0.9 10*3/uL (ref 0.1–1.0)
Monocytes Relative: 6 %
Neutro Abs: 12.1 10*3/uL — ABNORMAL HIGH (ref 1.7–7.7)
Neutrophils Relative %: 90 %
Platelets: 233 10*3/uL (ref 150–400)
RBC: 3.85 MIL/uL — ABNORMAL LOW (ref 4.22–5.81)
RDW: 16.4 % — ABNORMAL HIGH (ref 11.5–15.5)
WBC: 13.5 10*3/uL — ABNORMAL HIGH (ref 4.0–10.5)
nRBC: 0 % (ref 0.0–0.2)

## 2022-03-25 LAB — URINALYSIS, ROUTINE W REFLEX MICROSCOPIC
Bilirubin Urine: NEGATIVE
Glucose, UA: NEGATIVE mg/dL
Ketones, ur: NEGATIVE mg/dL
Nitrite: NEGATIVE
Protein, ur: >=300 mg/dL — AB
RBC / HPF: >50 RBC/hpf — ABNORMAL HIGH (ref 0–5)
Specific Gravity, Urine: 1.023 (ref 1.005–1.030)
WBC, UA: >50 WBC/hpf — ABNORMAL HIGH (ref 0–5)
pH: 5 (ref 5.0–8.0)

## 2022-03-25 LAB — PROTIME-INR
INR: 1.3 — ABNORMAL HIGH (ref 0.8–1.2)
Prothrombin Time: 15.9 seconds — ABNORMAL HIGH (ref 11.4–15.2)

## 2022-03-25 LAB — LACTIC ACID, PLASMA
Lactic Acid, Venous: 1.5 mmol/L (ref 0.5–1.9)
Lactic Acid, Venous: 1.9 mmol/L (ref 0.5–1.9)

## 2022-03-25 LAB — APTT: aPTT: 34 seconds (ref 24–36)

## 2022-03-25 LAB — LIPASE, BLOOD: Lipase: 35 U/L (ref 11–51)

## 2022-03-25 MED ORDER — LATANOPROST 0.005 % OP SOLN
1.0000 [drp] | Freq: Every day | OPHTHALMIC | Status: DC
  Administered 2022-03-25 – 2022-03-26 (×2): 1 [drp] via OPHTHALMIC
  Filled 2022-03-25: qty 2.5

## 2022-03-25 MED ORDER — CARBIDOPA-LEVODOPA 25-100 MG PO TABS
2.0000 | ORAL_TABLET | Freq: Every day | ORAL | Status: DC
Start: 2022-03-25 — End: 2022-03-27
  Administered 2022-03-26 – 2022-03-27 (×3): 2 via ORAL
  Filled 2022-03-25 (×14): qty 2

## 2022-03-25 MED ORDER — SODIUM CHLORIDE 0.9 % IV SOLN
500.0000 mg | INTRAVENOUS | Status: DC
  Administered 2022-03-26 – 2022-03-27 (×2): 500 mg via INTRAVENOUS
  Filled 2022-03-25 (×2): qty 5

## 2022-03-25 MED ORDER — CARBIDOPA-LEVODOPA ER 50-200 MG PO TBCR
1.0000 | EXTENDED_RELEASE_TABLET | Freq: Every day | ORAL | Status: DC
Start: 2022-03-25 — End: 2022-03-27
  Administered 2022-03-27: 1 via ORAL
  Filled 2022-03-25: qty 2
  Filled 2022-03-25 (×2): qty 1

## 2022-03-25 MED ORDER — VANCOMYCIN HCL 750 MG/150ML IV SOLN
750.0000 mg | Freq: Two times a day (BID) | INTRAVENOUS | Status: DC
  Administered 2022-03-26: 750 mg via INTRAVENOUS
  Filled 2022-03-25 (×2): qty 150

## 2022-03-25 MED ORDER — APIXABAN 5 MG PO TABS
5.0000 mg | ORAL_TABLET | Freq: Two times a day (BID) | ORAL | Status: DC
  Administered 2022-03-26 – 2022-03-27 (×3): 5 mg via ORAL
  Filled 2022-03-25 (×4): qty 1

## 2022-03-25 MED ORDER — VANCOMYCIN HCL 1250 MG/250ML IV SOLN
1250.0000 mg | Freq: Once | INTRAVENOUS | Status: AC
  Administered 2022-03-25: 1250 mg via INTRAVENOUS
  Filled 2022-03-25: qty 250

## 2022-03-25 MED ORDER — MELATONIN 3 MG PO TABS
6.0000 mg | ORAL_TABLET | Freq: Every day | ORAL | Status: DC
  Administered 2022-03-26: 6 mg via ORAL
  Filled 2022-03-25: qty 2

## 2022-03-25 MED ORDER — ONDANSETRON HCL 4 MG/2ML IJ SOLN
4.0000 mg | Freq: Once | INTRAMUSCULAR | Status: AC
  Administered 2022-03-25: 4 mg via INTRAVENOUS
  Filled 2022-03-25: qty 2

## 2022-03-25 MED ORDER — SODIUM CHLORIDE 0.45 % IV SOLN: INTRAVENOUS | Status: AC

## 2022-03-25 MED ORDER — ONDANSETRON HCL 4 MG/2ML IJ SOLN: 4.0000 mg | Freq: Four times a day (QID) | INTRAMUSCULAR | Status: DC | PRN

## 2022-03-25 MED ORDER — HYDRALAZINE HCL 20 MG/ML IJ SOLN: 10.0000 mg | INTRAMUSCULAR | Status: DC | PRN

## 2022-03-25 MED ORDER — ALBUTEROL SULFATE (2.5 MG/3ML) 0.083% IN NEBU: 2.5000 mg | INHALATION_SOLUTION | Freq: Four times a day (QID) | RESPIRATORY_TRACT | Status: DC | PRN

## 2022-03-25 MED ORDER — ENSURE ENLIVE PO LIQD
237.0000 mL | Freq: Three times a day (TID) | ORAL | Status: DC
  Administered 2022-03-26: 237 mL via ORAL
  Filled 2022-03-25 (×5): qty 237

## 2022-03-25 MED ORDER — IBUPROFEN 100 MG/5ML PO SUSP
600.0000 mg | Freq: Once | ORAL | Status: AC
Start: 2022-03-25 — End: 2022-03-25
  Administered 2022-03-25: 600 mg via ORAL
  Filled 2022-03-25: qty 30

## 2022-03-25 MED ORDER — POTASSIUM CHLORIDE 20 MEQ PO PACK
40.0000 meq | PACK | Freq: Once | ORAL | Status: DC
  Filled 2022-03-25: qty 2

## 2022-03-25 MED ORDER — SODIUM CHLORIDE 0.9 % IV SOLN
2.0000 g | Freq: Once | INTRAVENOUS | Status: AC
  Administered 2022-03-25: 2 g via INTRAVENOUS
  Filled 2022-03-25: qty 12.5

## 2022-03-25 MED ORDER — LACTATED RINGERS IV BOLUS
1000.0000 mL | Freq: Once | INTRAVENOUS | Status: AC
  Administered 2022-03-25: 1000 mL via INTRAVENOUS

## 2022-03-25 MED ORDER — ACETAMINOPHEN 325 MG PO TABS: 650.0000 mg | ORAL_TABLET | Freq: Four times a day (QID) | ORAL | Status: DC | PRN

## 2022-03-25 MED ORDER — SODIUM CHLORIDE 0.9 % IV SOLN
2.0000 g | INTRAVENOUS | Status: DC
  Administered 2022-03-26: 2 g via INTRAVENOUS
  Filled 2022-03-25: qty 20

## 2022-03-25 MED ORDER — MORPHINE SULFATE (PF) 4 MG/ML IV SOLN
3.0000 mg | Freq: Once | INTRAVENOUS | Status: DC
  Filled 2022-03-25: qty 1

## 2022-03-25 MED ORDER — IOHEXOL 300 MG/ML  SOLN
100.0000 mL | Freq: Once | INTRAMUSCULAR | Status: AC | PRN
  Administered 2022-03-25: 100 mL via INTRAVENOUS

## 2022-03-25 MED ORDER — POLYETHYLENE GLYCOL 3350 17 G PO PACK
17.0000 g | PACK | Freq: Every day | ORAL | Status: DC | PRN
  Administered 2022-03-27: 17 g via ORAL
  Filled 2022-03-25: qty 1

## 2022-03-25 MED ORDER — ONDANSETRON HCL 4 MG PO TABS: 4.0000 mg | ORAL_TABLET | Freq: Four times a day (QID) | ORAL | Status: DC | PRN

## 2022-03-25 MED ORDER — ACETAMINOPHEN 650 MG RE SUPP: 650.0000 mg | Freq: Four times a day (QID) | RECTAL | Status: DC | PRN

## 2022-03-25 NOTE — ED Provider Notes (Signed)
[facility] Provider Note  CSN: [id number] Arrival date & time: 03/25/22 1539  Chief Complaint(s) Fever and Nausea  HPI Luke Quinn is a 88 y.o. male with PMH AAA status post EVAR in 2016, chronic A-fib on Eliquis, CAD, prostate cancer status post suprapubic catheter placement, COPD, lung cancer, Parkinson's disease, previous pulmonary embolism in 2022, PVD who presents emergency department for evaluation of fever, nausea, vomiting.  History obtained from patient's family who states that over the last 48 hours he has had a progressive decline with decreased p.o. intake, dry heaving and borderline fever at 100.1 at home.  Patient received Tylenol prior to arrival.  Additional history unable to bowl to be obtained due to patient's limited verbal ability.    Past Medical History Past Medical History:  Diagnosis Date   AAA (abdominal aortic aneurysm) ([place])    status post EVAR November 2016.   Atrial fibrillation [facility]) 10/2020   Feb 2022, started on Eliquis   CAD (coronary artery disease)    multiple cardiac stents placed years ago.   Cancer [facility])    prostate   COPD (chronic obstructive pulmonary disease) (HCC)    Depression    Hypertension    Lung cancer ([place])    right;  s/p SBRT treatment completed in November 2017   Macular degeneration, age related    Ocular hypertension    Parkinson's disease ([place])    Pulmonary embolism ([place])    Feb 2022, started on Eliquis   PVD (peripheral vascular disease) ([place])    Shingles    Patient Active Problem List   Diagnosis Date Noted   Incomplete bladder emptying 11/05/2021   Frequent UTI 11/05/2021   History of prostate cancer 11/05/2021   Chronic indwelling Foley catheter 11/05/2021   Parkinson's disease ([place]) 06/05/2020   Malignant neoplasm of right lung ([place]) 07/01/2018   Non-small cell lung cancer, right ([place]) 05/13/2018   Abdominal aortic aneurysm (AAA) without rupture ([place]) 09/20/2015   Chronic obstructive  pulmonary disease ([place]) 09/20/2015   Coronary artery disease involving native heart without angina pectoris 09/20/2015   Essential hypertension 09/20/2015   Home Medication(s) Prior to Admission medications   Medication Sig Start Date End Date Taking? Authorizing Provider  acetaminophen (TYLENOL) 500 MG tablet Take 500 mg by mouth 2 (two) times daily.    [provider]  albuterol (PROVENTIL) (2.5 MG/3ML) 0.083% nebulizer solution Take 2.5 mg by nebulization every 6 (six) hours as needed for wheezing or shortness of breath.    [provider]  apixaban (ELIQUIS) 5 MG TABS tablet Take 1 tablet (5 mg total) by mouth 2 (two) times daily. 10/23/20   Daleen Bo, MD  carbidopa-levodopa (SINEMET CR) 50-200 MG tablet Take 1 tablet by mouth at bedtime.    [provider]  carbidopa-levodopa (SINEMET IR) 25-100 MG tablet Take 2 tablets by mouth 5 (five) times daily.    [provider]  carboxymethylcellulose 1 % ophthalmic solution Place 1 drop into both eyes 5 (five) times daily.    [provider]  cholecalciferol (VITAMIN D3) 25 MCG (1000 UNIT) tablet Take 1,000 Units by mouth daily.    [provider]  Coenzyme Q10 (COQ10) 100 MG CAPS Take 100 mg by mouth daily.    [provider]  diclofenac Sodium (VOLTAREN) 1 % GEL Apply 1 application. topically 4 (four) times daily as needed (pain).    [provider]  famotidine (PEPCID) 20 MG tablet Take 20 mg by mouth daily.  [provider]  ferrous sulfate 325 (65 FE) MG tablet Take 325 mg by mouth every Monday, Wednesday, and Friday.    [provider]  furosemide (LASIX) 20 MG tablet Take 10 mg by mouth daily.    [provider]  GLUCOSAMINE CHONDROITIN MSM PO Take 1 tablet by mouth 2 times daily at 12 noon and 4 pm.    [provider]  guaifenesin (HUMIBID E) 400 MG TABS tablet Take 400 mg by mouth 3 (three) times daily.    [provider]  hydrALAZINE (APRESOLINE) 100 MG tablet Take 100 mg by mouth 2 (two) times daily.    [provider]  latanoprost (XALATAN) 0.005 % ophthalmic solution Place 1 drop into both eyes at bedtime.    [provider]  lidocaine (LIDODERM) 5 % Place 1 patch onto the skin daily as needed (back pain).    [provider]  lidocaine (LMX) 4 % cream Apply 1 application. topically 3 (three) times daily as needed (back pain).    [provider]  losartan (COZAAR) 100 MG tablet Take 100 mg by mouth daily.    [provider]  Melatonin 5 MG CAPS Take 5 mg by mouth at bedtime.    [provider]  metoprolol tartrate (LOPRESSOR) 50 MG tablet Take 25 mg by mouth 2 (two) times daily.    [provider]  Multiple Vitamins-Minerals (ICAPS AREDS 2 PO) Take 1 capsule by mouth 2 (two) times daily.    [provider]  Nutritional Supplements (ENSURE PO) Take 1 Container by mouth in the morning, at noon, in the evening, and at bedtime.    [provider]  nystatin cream (MYCOSTATIN) Apply 1 application. topically daily.    [provider]  Omega-3 Fatty Acids (FISH OIL) 1000 MG CAPS Take 1,000 mg by mouth daily.    [provider]  polyethylene glycol (MIRALAX / GLYCOLAX) 17 g packet Take 17 g by mouth daily.    [provider]  potassium chloride SA (KLOR-CON M) 20 MEQ tablet Take 20 mEq by mouth 2 (two) times daily.    [provider]  prazosin (MINIPRESS) 1 MG capsule Take 1 mg by mouth at bedtime.    [provider]  rasagiline (AZILECT) 1 MG TABS tablet Take 1 tablet by mouth daily.    [provider]  White Petrolatum-Mineral Oil (LUBRICANT EYE) OINT Place 1 application. into both eyes at bedtime.    [provider]                                                                                                                                    Past Surgical History Past  Surgical History:  Procedure Laterality Date   cardiac stents     IR CATHETER TUBE CHANGE  01/10/2022   PROSTATE SURGERY     Family History Family History  Problem Relation  Age of Onset   Diabetes Mother    Cancer Mother    Hypertension Mother    Diabetes Father     Social History Social History   Tobacco Use   Smoking status: Former    Types: Cigarettes    Quit date: 06/21/1995    Years since quitting: 26.7   Smokeless tobacco: Never  Vaping Use   Vaping Use: Never used  Substance Use Topics   Alcohol use: No    Alcohol/week: 0.0 standard drinks of alcohol   Drug use: No   Allergies Gabapentin and Atorvastatin  Review of Systems Review of Systems  Constitutional:  Positive for fatigue and fever.  Respiratory:  Positive for cough.   Gastrointestinal:  Positive for nausea.    Physical Exam Vital Signs  I have reviewed the triage vital signs BP (!) 124/59 (BP Location: Left Arm)   Pulse 75   Temp 99.8 F (37.7 C) (Rectal)   Resp 19   SpO2 93%   Physical Exam Constitutional:      General: He is not in acute distress.    Appearance: Normal appearance.  HENT:     Head: Normocephalic and atraumatic.     Nose: No congestion or rhinorrhea.  Eyes:     General:        Right eye: No discharge.        Left eye: No discharge.     Extraocular Movements: Extraocular movements intact.     Pupils: Pupils are equal, round, and reactive to light.  Cardiovascular:     Rate and Rhythm: Normal rate and regular rhythm.     Heart sounds: No murmur heard. Pulmonary:     Effort: No respiratory distress.     Breath sounds: Rales present. No wheezing.  Abdominal:     General: There is no distension.     Tenderness: There is abdominal tenderness. There is guarding.  Musculoskeletal:        General: Normal range of motion.     Cervical back: Normal range of motion.  Skin:    General: Skin is warm and dry.  Neurological:     General: No focal deficit present.     Mental  Status: He is alert.     ED Results and Treatments Labs (all labs ordered are listed, but only abnormal results are displayed) Labs Reviewed  CULTURE, BLOOD (ROUTINE X 2)  CULTURE, BLOOD (ROUTINE X 2)  URINE CULTURE  LIPASE, BLOOD  LACTIC ACID, PLASMA  LACTIC ACID, PLASMA  COMPREHENSIVE METABOLIC PANEL  CBC WITH DIFFERENTIAL/PLATELET  PROTIME-INR  APTT  URINALYSIS, ROUTINE W REFLEX MICROSCOPIC                                                                                                                          Radiology No results found.  Pertinent labs & imaging results that were available during my care of the patient were reviewed by me and considered in my medical decision  making (see MDM for details).  Medications Ordered in ED Medications - No data to display                                                                                                                                   Procedures .Critical Care  Performed by: Teressa Lower, MD Authorized by: Teressa Lower, MD   Critical care provider statement:    Critical care time (minutes):  30   Critical care was necessary to treat or prevent imminent or life-threatening deterioration of the following conditions:  Sepsis   Critical care was time spent personally by me on the following activities:  Development of treatment plan with patient or surrogate, discussions with consultants, evaluation of patient's response to treatment, examination of patient, ordering and review of laboratory studies, ordering and review of radiographic studies, ordering and performing treatments and interventions, pulse oximetry, re-evaluation of patient's condition and review of old charts   (including critical care time)  Medical Decision Making / ED Course   This patient presents to the ED for concern of abdominal pain, cough, fever, this involves an extensive number of treatment options, and is a complaint that carries with  it a high risk of complications and morbidity.  The differential diagnosis includes sepsis, pneumonia, intra-abdominal infection, UTI  MDM: Patient seen emergency room for evaluation of multiple complaints described above.  Physical exam reveals a cachectic appearing patient with tenderness in the right left lower quadrants as well as rales in the right lobe.  Aeration with a leukocytosis to 13.5, hemoglobin 11.0, calcium significantly elevated 11.1, urinalysis with moderate leuk esterase, greater than 50 red blood cells, greater than 50 white blood cells but this may represent chronic colonization.  Lactic acid normal.  CT chest abdomen pelvis showing left lower lobe pneumonia, and a possible endoleak and a 6.1 cm AAA.  I spoke with Dr. Donzetta Matters of vascular surgery who evaluated this image and states that this is quite stable from previous imaging and will require outpatient follow-up but does not require any additional work-up while in the hospital this admission.  Okay to restart Eliquis.  Broad-spectrum antibiotics initiated and patient admitted.  Fluids started for patient's hypercalcemia.   Additional history obtained: -Additional history obtained from multiple family members -External records from outside source obtained and reviewed including: Chart review including previous notes, labs, imaging, consultation notes   Lab Tests: -I ordered, reviewed, and interpreted labs.   The pertinent results include:   Labs Reviewed  CULTURE, BLOOD (ROUTINE X 2)  CULTURE, BLOOD (ROUTINE X 2)  URINE CULTURE  LIPASE, BLOOD  LACTIC ACID, PLASMA  LACTIC ACID, PLASMA  COMPREHENSIVE METABOLIC PANEL  CBC WITH DIFFERENTIAL/PLATELET  PROTIME-INR  APTT  URINALYSIS, ROUTINE W REFLEX MICROSCOPIC      EKG   EKG Interpretation  Date/Time:  Tuesday March 25 2022 15:58:33 EDT Ventricular Rate:  97 PR Interval:    QRS Duration:  119 QT Interval:  334 QTC Calculation: 425 R Axis:   -61 Text  Interpretation: Atrial fibrillation Paired ventricular premature complexes Confirmed by Jayde Mcallister ([id number]) on 03/25/2022 11:28:24 PM         Imaging Studies ordered: I ordered imaging studies including CT chest and pelvis, chest x-ray I independently visualized and interpreted imaging. I agree with the radiologist interpretation   Medicines ordered and prescription drug management: No orders of the defined types were placed in this encounter.   -I have reviewed the patients home medicines and have made adjustments as needed  Critical interventions Fluids, antibiotics, sepsis care  Consultations Obtained: I requested consultation with the vascular surgeon Dr. Donzetta Matters,  and discussed lab and imaging findings as well as pertinent plan - they recommend: No additional interventions on CT findings of AAA   Cardiac Monitoring: The patient was maintained on a cardiac monitor.  I personally viewed and interpreted the cardiac monitored which showed an underlying rhythm of: Sinus tachycardia, NSR  Social Determinants of Health:  Factors impacting patients care include: none   Reevaluation: After the interventions noted above, I reevaluated the patient and found that they have :improved  Co morbidities that complicate the patient evaluation  Past Medical History:  Diagnosis Date   AAA (abdominal aortic aneurysm) ([place])    status post EVAR November 2016.   Atrial fibrillation [facility]) 10/2020   Feb 2022, started on Eliquis   CAD (coronary artery disease)    multiple cardiac stents placed years ago.   Cancer [facility])    prostate   COPD (chronic obstructive pulmonary disease) (HCC)    Depression    Hypertension    Lung cancer ([place])    right;  s/p SBRT treatment completed in November 2017   Macular degeneration, age related    Ocular hypertension    Parkinson's disease ([place])    Pulmonary embolism ([place])    Feb 2022, started on Eliquis   PVD (peripheral vascular disease) ([place])     Shingles       Dispostion: I considered admission for this patient, and due to left lower lobe pneumonia patient will require hospital admission     Final Clinical Impression(s) / ED Diagnoses Final diagnoses:  None     @PCDICTATION @    Teressa Lower, MD 03/25/22 2329

## 2022-03-25 NOTE — Assessment & Plan Note (Signed)
Bedbound.  Currently with altered mental status.   - Hold home Sinemet for now and other home medications

## 2022-03-25 NOTE — Assessment & Plan Note (Signed)
Rate controlled on metoprolol and anticoagulation with Eliquis. -  Eliquis -hold home oral medications while n.p.o.

## 2022-03-25 NOTE — H&P (Signed)
History and Physical    [name] TKZ:[id number] DOB: May 01, 1934 DOA: 03/25/2022  PCP: [facility]   Patient coming from: Home  I have personally briefly reviewed patient's old medical records in [place]  Chief Complaint: Fever  HPI: Luke Quinn is a 88 y.o. male with medical history significant for Parkinson's disease, with suprapubic catheter, bedbound, prostate cancer, COPD, hypertension, atrial fibrillation.  At the time of my evaluation patient is sleepy/lethargic.  Awakens to touch.  Daughter and spouse at bedside.  They report fever of 100.1.  He has a chronic cough that is unchanged.  He reports dry heaving but no actual vomiting.  No difficulty breathing.  No loose stools, no complaints of pain.  Reports over the past few days patient has been increasingly sleepy/lethargic. He reports intermittent difficulties following and possible aspiration while eating.  He has been working with a Astronomer. Patient has had prior UTIs, reported last urine culture about 5 to 6 weeks ago grew VRE and Pseudomonas.  With very few antibiotics to treat.  They were told that patient was colonized. They deny any presence of bed ulcers or wounds.  Patient has not ambulated in at least a year.  ED Course: Tmax 99.5.  Heart rate 48- 91.  Respiratory rate 16-25.  Blood pressure systolic 1 57-3 63.  O2 sats greater than 93% on room air. WBC 13.5.  Lactic acid 1.5 > 0.9.  UA with moderate leukocytes rare bacteria, large blood. CT chest abdomen and pelvis with contrast-suggest patchy consolidation in left lower lobe and left upper lobe suspicious for pneumonia. 1 L bolus given. IV cefepime and vancomycin started.  Review of Systems: Limited due to altered mental status  Past Medical History:  Diagnosis Date   AAA (abdominal aortic aneurysm) ([place])    status post EVAR November 2016.   Atrial fibrillation [facility]) 10/2020   Feb 2022, started on Eliquis   CAD (coronary artery  disease)    multiple cardiac stents placed years ago.   Cancer [facility])    prostate   COPD (chronic obstructive pulmonary disease) (HCC)    Depression    Hypertension    Lung cancer ([place])    right;  s/p SBRT treatment completed in November 2017   Macular degeneration, age related    Ocular hypertension    Parkinson's disease ([place])    Pulmonary embolism ([place])    Feb 2022, started on Eliquis   PVD (peripheral vascular disease) ([place])    Shingles     Past Surgical History:  Procedure Laterality Date   cardiac stents     IR CATHETER TUBE CHANGE  01/10/2022   PROSTATE SURGERY       reports that he quit smoking about 26 years ago. His smoking use included cigarettes. He has never used smokeless tobacco. He reports that he does not drink alcohol and does not use drugs.  Allergies  Allergen Reactions   Gabapentin     Pt is unable to maintain his balance while taking this med    Atorvastatin     Ankle swelling    Family History  Problem Relation Age of Onset   Diabetes Mother    Cancer Mother    Hypertension Mother    Diabetes Father    Prior to Admission medications   Medication Sig Start Date End Date Taking? Authorizing Provider  acetaminophen (TYLENOL) 500 MG tablet Take 500 mg by mouth 2 (two) times daily.   Yes [provider]  albuterol (PROVENTIL) (2.5 MG/3ML) 0.083% nebulizer solution Take 2.5 mg by nebulization every 6 (six) hours as needed for wheezing or shortness of breath.   Yes [provider]  apixaban (ELIQUIS) 5 MG TABS tablet Take 1 tablet (5 mg total) by mouth 2 (two) times daily. 10/23/20  Yes Daleen Bo, MD  carbidopa-levodopa (SINEMET CR) 50-200 MG tablet Take 1 tablet by mouth at bedtime.   Yes [provider]  carboxymethylcellulose 1 % ophthalmic solution Place 1 drop into both eyes 5 (five) times daily.   Yes [provider]  cholecalciferol (VITAMIN D3) 25 MCG (1000 UNIT) tablet Take 1,000 Units by mouth daily.    Yes [provider]  diclofenac Sodium (VOLTAREN) 1 % GEL Apply 1 application. topically 4 (four) times daily as needed (pain).   Yes [provider]  famotidine (PEPCID) 20 MG tablet Take 20 mg by mouth daily.   Yes [provider]  ferrous sulfate 325 (65 FE) MG tablet Take 325 mg by mouth every Monday, Wednesday, and Friday.   Yes [provider]  furosemide (LASIX) 20 MG tablet Take 10 mg by mouth daily.   Yes [provider]  guaifenesin (HUMIBID E) 400 MG TABS tablet Take 400 mg by mouth 2 (two) times daily.   Yes [provider]  hydrALAZINE (APRESOLINE) 100 MG tablet Take 100 mg by mouth 2 (two) times daily.   Yes [provider]  latanoprost (XALATAN) 0.005 % ophthalmic solution Place 1 drop into both eyes at bedtime.   Yes [provider]  lidocaine (LIDODERM) 5 % Place 1 patch onto the skin daily as needed (back pain).   Yes [provider]  lidocaine (LMX) 4 % cream Apply 1 application. topically 3 (three) times daily as needed (back pain).   Yes [provider]  losartan (COZAAR) 100 MG tablet Take 100 mg by mouth daily.   Yes [provider]  Melatonin 5 MG CAPS Take 5 mg by mouth at bedtime.   Yes [provider]  metoprolol tartrate (LOPRESSOR) 50 MG tablet Take 25 mg by mouth 2 (two) times daily.   Yes [provider]  Multiple Vitamins-Minerals (ICAPS AREDS 2 PO) Take 1 capsule by mouth 2 (two) times daily.   Yes [provider]  Nutritional Supplements (ENSURE PO) Take 1 Container by mouth in the morning, at noon, in the evening, and at bedtime.   Yes [provider]  nystatin cream (MYCOSTATIN) Apply 1 application. topically daily.   Yes [provider]  polyethylene glycol (MIRALAX / GLYCOLAX) 17 g packet Take 17 g by mouth daily.   Yes [provider]  potassium chloride SA (KLOR-CON M) 20 MEQ tablet Take 20 mEq by mouth 2  (two) times daily.   Yes [provider]  prazosin (MINIPRESS) 1 MG capsule Take 1 mg by mouth at bedtime.   Yes [provider]  rasagiline (AZILECT) 1 MG TABS tablet Take 1 tablet by mouth daily.   Yes [provider]  White Petrolatum-Mineral Oil (LUBRICANT EYE) OINT Place 1 application. into both eyes at bedtime.   Yes [provider]  carbidopa-levodopa (SINEMET IR) 25-100 MG tablet Take 2 tablets by mouth 5 (five) times daily.    [provider]    Physical Exam: Vitals:   03/25/22 1800 03/25/22 1817 03/25/22 1900 03/25/22 1930  BP: 137/88  140/80   Pulse: (!) 48  64   Resp: 19  18  Temp:    99.5 F (37.5 C)  TempSrc:    Oral  SpO2: 95%  96%   Weight:  61.2 kg    Height:  6\' 4"  (1.93 m)      Constitutional: Sleepy but arouses to touch Vitals:   03/25/22 1800 03/25/22 1817 03/25/22 1900 03/25/22 1930  BP: 137/88  140/80   Pulse: (!) 48  64   Resp: 19  18   Temp:    99.5 F (37.5 C)  TempSrc:    Oral  SpO2: 95%  96%   Weight:  61.2 kg    Height:  6\' 4"  (1.93 m)     Eyes: PERRL, lids and conjunctivae normal ENMT: Mucous membranes are moist.   Neck: normal, supple, no masses, no thyromegaly Respiratory: clear to auscultation bilaterally, no wheezing, no crackles.  Cardiovascular: Regular rate and rhythm, no murmurs / rubs / gallops. No extremity edema. 2+ pedal pulses. No carotid bruits.  Abdomen: no tenderness, no masses palpated. No hepatosplenomegaly. Bowel sounds positive.  Musculoskeletal: no clubbing / cyanosis. No joint deformity upper and lower extremities. Good ROM, no contractures. Normal muscle tone.  Skin: Suprapubic catheter present, skin clean, no erythema or drainage, no rashes, lesions, ulcers. No induration Neurologic: Limited exam due to lethargy, able to move bilateral lower extremity against gravity, moving all extremities spontaneously. Psychiatric: Lethargic.  Responds to touch and voice.   Labs on  Admission: I have personally reviewed following labs and imaging studies  CBC: Recent Labs  Lab 03/25/22 1615  WBC 13.5*  NEUTROABS 12.1*  HGB 11.0*  HCT 35.4*  MCV 91.9  PLT 725   Basic Metabolic Panel: Recent Labs  Lab 03/25/22 1615  NA 144  K 3.5  CL 113*  CO2 25  GLUCOSE 126*  BUN 23  CREATININE 0.73  CALCIUM 11.1*   GFR: Estimated Creatinine Clearance: 57.4 mL/min (by C-G formula based on SCr of 0.73 mg/dL). Liver Function Tests: Recent Labs  Lab 03/25/22 1615  AST 12*  ALT 7  ALKPHOS 87  BILITOT 0.9  PROT 6.4*  ALBUMIN 3.1*   Recent Labs  Lab 03/25/22 1615  LIPASE 35   No results for input(s): "AMMONIA" in the last 168 hours. Coagulation Profile: Recent Labs  Lab 03/25/22 1615  INR 1.3*   Urine analysis:    Component Value Date/Time   COLORURINE AMBER (A) 03/25/2022 1813   APPEARANCEUR CLOUDY (A) 03/25/2022 1813   APPEARANCEUR Cloudy (A) 01/20/2022 1535   LABSPEC 1.023 03/25/2022 1813   PHURINE 5.0 03/25/2022 1813   GLUCOSEU NEGATIVE 03/25/2022 1813   HGBUR LARGE (A) 03/25/2022 1813   BILIRUBINUR NEGATIVE 03/25/2022 1813   BILIRUBINUR Negative 01/20/2022 1535   KETONESUR NEGATIVE 03/25/2022 1813   PROTEINUR >=300 (A) 03/25/2022 1813   NITRITE NEGATIVE 03/25/2022 1813   LEUKOCYTESUR MODERATE (A) 03/25/2022 1813    Radiological Exams on Admission: CT CHEST ABDOMEN PELVIS W CONTRAST  Result Date: 03/25/2022 CLINICAL DATA:  Fever sepsis history of right lung cancer prostate cancer EXAM: CT CHEST, ABDOMEN, AND PELVIS WITH CONTRAST TECHNIQUE: Multidetector CT imaging of the chest, abdomen and pelvis was performed following the standard protocol during bolus administration of intravenous contrast. RADIATION DOSE REDUCTION: This exam was performed according to the departmental dose-optimization program which includes automated exposure control, adjustment of the mA and/or kV according to patient size and/or use of iterative reconstruction  technique. CONTRAST:  157mL OMNIPAQUE IOHEXOL 300 MG/ML  SOLN COMPARISON:  Chest x-ray 03/25/2022, CT 10/23/2020, 11/22/2021 02/21/2022 FINDINGS:  CT CHEST FINDINGS Cardiovascular: Advanced aortic atherosclerosis. Ectatic ascending aorta up to 3.9 cm. No dissection is seen. Coronary vascular calcification. Borderline to mild cardiomegaly. No pericardial effusion. Mediastinum/Nodes: Midline trachea. 2.5 cm hypodense left thyroid nodule. Borderline low paratracheal node measuring 9 mm. Esophagus within normal limits. Lungs/Pleura: Heterogeneous ground-glass density and patchy consolidations in the left lower lobe and posterior left upper lobe suspicious for a pneumonia. Curvilinear somewhat bandlike density in the right upper lobe extending to pleural surface, grossly stable as compared with chest CT from February 2022. Subpleural right middle lobe nodular density measuring 15 mm, previously 15 mm. Posterior right lower lobe subpleural nodular density measuring 2 cm, previously 2.2 cm. Musculoskeletal: Sternum is intact. There are degenerative changes of the spine. No acute osseous abnormality. CT ABDOMEN PELVIS FINDINGS Hepatobiliary: No calcified gallstone. No focal hepatic abnormality. No biliary dilatation. Pancreas: Unremarkable. No pancreatic ductal dilatation or surrounding inflammatory changes. Spleen: Normal in size without focal abnormality. Adrenals/Urinary Tract: Right adrenal gland is stable. Stable 15 mm left adrenal nodule consistent with adenoma. Kidneys show no hydronephrosis. Small bilateral kidney stones. Bilateral renal cysts. Multiple slightly dense lesions in the left kidney, too small to further characterize, no specific follow-up imaging is recommended. Suprapubic catheter within the bladder. Bladder appears thick walled. Stomach/Bowel: The stomach is nonenlarged. No dilated small bowel. No acute bowel wall thickening. Negative appendix. Diverticular disease of the left colon without acute  inflammatory process. Vascular/Lymphatic: Advanced aortic atherosclerosis. Status post aortic stent graft for aneurysm. Residual sac diameter measures 6.1 by 5.4 cm, previously 6.1 x 5.3 cm when measured in similar fashion. Patchy enhancement within the aneurysm sac on delayed views, consistent with an endoleak. Reproductive: Prostatectomy Other: Negative for pelvic effusion or free air Musculoskeletal: No acute or suspicious osseous abnormality IMPRESSION: 1. Heterogeneous ground-glass density and patchy consolidations in the left lower lobe and posterior left upper lobe suspicious for pneumonia. 2. Somewhat bandlike density in the right apical upper lobe extending to pleural surface potentially due to scarring, stable since February 2022 but new compared to radiographs prior to this. Stable subpleural nodular densities at the right middle lobe and right base also stable since 2022, favoring a benign process, but consider 1 year follow-up to ensure continued stability. 3. Status post aortic stent graft for infrarenal abdominal aortic aneurysm. Residual sac diameter up to 6.1 cm, grossly unchanged as compared with March 2023; globular enhancement within the aneurysm sac on delayed views, consistent with an endoleak. No signs of retroperitoneal hematoma or stranding. Vascular surgery follow-up is recommended. 4. Nonobstructing bilateral kidney stones. Interval placement of suprapubic catheter. Thick-walled appearance of the urinary bladder. 5. 2.5 cm hypodense nodule in the left lobe of the thyroid. Recommend thyroid US as clinically indicated. (Ref: J Am Coll Radiol. 2015 Feb;12(2): 143-50).This should be performed on a nonemergent basis. Electronically Signed   By: Donavan Foil M.D.   On: 03/25/2022 19:27   DG Chest Port 1 View  Result Date: 03/25/2022 CLINICAL DATA:  Possible sepsis EXAM: PORTABLE CHEST 1 VIEW COMPARISON:  02/25/2022 FINDINGS: Transverse diameter of hot is increased. There are no signs of  pulmonary edema. There is new linear density in right apical region. Increased markings are seen in left lower lung field. There is no pleural effusion or pneumothorax. IMPRESSION: Cardiomegaly. There are no signs of pulmonary edema. There is a linear density in the right apical region. Increased markings are seen in left lower lung field. Findings suggest subsegmental atelectasis or pneumonia. Electronically Signed  By: Elmer Picker M.D.   On: 03/25/2022 16:51    EKG: Independently reviewed.  Sinus rhythm rate 97, QTc 425.  PVCs, LAFB.  Assessment/Plan Principal Problem:   PNA (pneumonia) Active Problems:   Chronic obstructive pulmonary disease (HCC)   Essential hypertension   Parkinson's disease (HCC)   Chronic indwelling Foley catheter   Acute metabolic encephalopathy   Assessment and Plan: * PNA (pneumonia) Tmax here 99.8.  Has a chronic cough.  Reported fever of 99.8 at home.  Rules out for sepsis.  Lactic acid 1.5 > 1.9. CT showing left lower lobe left upper lobe pneumonia.  Family reports intermittent dysphagia/aspiration episodes while eating.  WBC 13.5. -Speech therapy evaluation -IV ceftriaxone and azithromycin -Follow-up blood and urine cultures -Hydrate   Atrial fibrillation (HCC) Rate controlled on metoprolol and anticoagulation with Eliquis. -  Eliquis -hold home oral medications while n.p.o.  Acute metabolic encephalopathy Lethargic.  Likely due to pneumonia.  Patient's bedbound not ambulatory.  Family reports patient has not fallen.  No head CT or imaging on file. -Will obtain CT head -IV antibiotics - 1/2 N/s 75cc/hr x 20hrs  Chronic indwelling Foley catheter Presenting with fever.  Follows regularly with urologist Dr. Felipa Eth.  Last urine culture 02/17/2022 grew vancomycin-resistant Enterococcus-sensitive only to quinupristin/dalfopristin.  Patient with history of colonization with Pseudomonas and VRE.  Followed up with ID 6/13 for this.  Parkinson's  disease ([place]) Bedbound.  Currently with altered mental status.   - Hold home Sinemet for now and other home medications  Essential hypertension Stable. -Hold home hydralazine, losartan, metoprolol, prazosin while n.p.o. -As needed hydralazine for systolic greater than 119  Chronic obstructive pulmonary disease (HCC) Stable    DVT prophylaxis: Eliquis Code Status: DNR.  Patient has a DNR form.  Confirmed with spouse and daughter at bedside. Family Communication: Daughter (is an Therapist, sports )and spouse at bedside. Disposition Plan: ~ 2 days Consults called: none Admission status: inpt tele I certify that at the point of admission it is my clinical judgment that the patient will require inpatient hospital care spanning beyond 2 midnights from the point of admission due to high intensity of service, high risk for further deterioration and high frequency of surveillance required.   Author: Bethena Roys, MD 03/25/2022 12:01 AM  For on call review [email or web address].

## 2022-03-25 NOTE — Progress Notes (Signed)
Pharmacy Antibiotic Note  Luke Quinn is a 88 y.o. male admitted on 03/25/2022 with sepsis. Pharmacy has been consulted for vancomycin dosing. Cefepime x1 ordered by EDP. Cr stable and at BL.  Plan: Vancomycin 1250mg  IV x1 then 750mg  q12h - est AUC 482 Follow Cr, LOT, Cx    Temp (24hrs), Avg:99.8 F (37.7 C), Min:99.8 F (37.7 C), Max:99.8 F (37.7 C)  Recent Labs  Lab 03/25/22 1615  WBC 13.5*  CREATININE 0.73    CrCl cannot be calculated (Unknown ideal weight.).    Allergies  Allergen Reactions   Gabapentin     Pt is unable to maintain his balance while taking this med    Atorvastatin     Ankle swelling    Arrie Senate, PharmD, BCPS, [facility] Clinical Pharmacist Please check AMION for all [facility] Pharmacy numbers 03/25/2022

## 2022-03-25 NOTE — ED Triage Notes (Signed)
Patient brought in by EMS for fever and nausea.  EMS states patient has a fever of 100.1 at home.   Also has had nausea last couple of days.

## 2022-03-25 NOTE — Assessment & Plan Note (Addendum)
Tmax here 99.8.  Has a chronic cough.  Reported fever of 99.8 at home.  Rules out for sepsis.  Lactic acid 1.5 > 1.9. CT showing left lower lobe left upper lobe pneumonia.  Family reports intermittent dysphagia/aspiration episodes while eating.  WBC 13.5. -Speech therapy evaluation -IV ceftriaxone and azithromycin -Follow-up blood and urine cultures -Hydrate

## 2022-03-25 NOTE — Assessment & Plan Note (Signed)
Presenting with fever.  Follows regularly with urologist Dr. Felipa Eth.  Last urine culture 02/17/2022 grew vancomycin-resistant Enterococcus-sensitive only to quinupristin/dalfopristin.  Patient with history of colonization with Pseudomonas and VRE.  Followed up with ID 6/13 for this.

## 2022-03-25 NOTE — Assessment & Plan Note (Addendum)
Stable. -Hold home hydralazine, losartan, metoprolol, prazosin while n.p.o. -As needed hydralazine for systolic greater than 161

## 2022-03-25 NOTE — Assessment & Plan Note (Addendum)
Lethargic.  Likely due to pneumonia.  Patient's bedbound not ambulatory.  Family reports patient has not fallen.  No head CT or imaging on file. -Will obtain CT head -IV antibiotics - 1/2 N/s 75cc/hr x 20hrs

## 2022-03-25 NOTE — Assessment & Plan Note (Signed)
Stable

## 2022-03-26 ENCOUNTER — Inpatient Hospital Stay (HOSPITAL_COMMUNITY): Payer: No Typology Code available for payment source

## 2022-03-26 DIAGNOSIS — Z978 Presence of other specified devices: Secondary | ICD-10-CM | POA: Diagnosis not present

## 2022-03-26 DIAGNOSIS — G9341 Metabolic encephalopathy: Secondary | ICD-10-CM | POA: Diagnosis not present

## 2022-03-26 LAB — BASIC METABOLIC PANEL
Anion gap: 5 (ref 5–15)
BUN: 22 mg/dL (ref 8–23)
CO2: 25 mmol/L (ref 22–32)
Calcium: 10.3 mg/dL (ref 8.9–10.3)
Chloride: 113 mmol/L — ABNORMAL HIGH (ref 98–111)
Creatinine, Ser: 0.66 mg/dL (ref 0.61–1.24)
GFR, Estimated: >60 mL/min (ref >60)
Glucose, Bld: 96 mg/dL (ref 70–99)
Potassium: 3.4 mmol/L — ABNORMAL LOW (ref 3.5–5.1)
Sodium: 143 mmol/L (ref 135–145)

## 2022-03-26 LAB — URINE CULTURE

## 2022-03-26 LAB — CBC
HCT: 32.3 % — ABNORMAL LOW (ref 39.0–52.0)
Hemoglobin: 9.9 g/dL — ABNORMAL LOW (ref 13.0–17.0)
MCH: 29 pg (ref 26.0–34.0)
MCHC: 30.7 g/dL (ref 30.0–36.0)
MCV: 94.7 fL (ref 80.0–100.0)
Platelets: 197 10*3/uL (ref 150–400)
RBC: 3.41 MIL/uL — ABNORMAL LOW (ref 4.22–5.81)
RDW: 16.3 % — ABNORMAL HIGH (ref 11.5–15.5)
WBC: 8.5 10*3/uL (ref 4.0–10.5)
nRBC: 0 % (ref 0.0–0.2)

## 2022-03-26 LAB — GLUCOSE, CAPILLARY
Glucose-Capillary: 101 mg/dL — ABNORMAL HIGH (ref 70–99)
Glucose-Capillary: 111 mg/dL — ABNORMAL HIGH (ref 70–99)

## 2022-03-26 LAB — SARS CORONAVIRUS 2 BY RT PCR: SARS Coronavirus 2 by RT PCR: NEGATIVE

## 2022-03-26 MED ORDER — LOSARTAN POTASSIUM 50 MG PO TABS
100.0000 mg | ORAL_TABLET | Freq: Every day | ORAL | Status: DC
  Administered 2022-03-26: 100 mg via ORAL
  Filled 2022-03-26: qty 2

## 2022-03-26 MED ORDER — METOPROLOL TARTRATE 25 MG PO TABS
25.0000 mg | ORAL_TABLET | Freq: Two times a day (BID) | ORAL | Status: DC
  Administered 2022-03-26 – 2022-03-27 (×2): 25 mg via ORAL
  Filled 2022-03-26 (×3): qty 1

## 2022-03-26 MED ORDER — HYDRALAZINE HCL 25 MG PO TABS
100.0000 mg | ORAL_TABLET | Freq: Two times a day (BID) | ORAL | Status: DC
Start: 2022-03-26 — End: 2022-03-26
  Administered 2022-03-26: 100 mg via ORAL
  Filled 2022-03-26: qty 4

## 2022-03-26 MED ORDER — ENSURE ENLIVE PO LIQD
237.0000 mL | Freq: Three times a day (TID) | ORAL | Status: DC
  Administered 2022-03-26 – 2022-03-27 (×2): 237 mL via ORAL

## 2022-03-26 MED ORDER — ADULT MULTIVITAMIN W/MINERALS CH
1.0000 | ORAL_TABLET | Freq: Every day | ORAL | Status: DC
  Administered 2022-03-26 – 2022-03-27 (×2): 1 via ORAL
  Filled 2022-03-26 (×2): qty 1

## 2022-03-26 NOTE — TOC Progression Note (Signed)
Transition of Care [facility]) - Progression Note    Patient Details  Name: Luke Quinn MRN: [id number] Date of Birth: 1933/10/05  Transition of Care [facility]) CM/SW Contact  Salome Arnt, [place] Phone Number: 03/26/2022, 1:41 PM  Clinical Narrative:  LCSW spoke with pt's wife. Pt's wife reports pt is active with [facility]. PT and SLP recommending home health as well. Linda with [facility] notified and will add PT and SLF. MD notified for orders. TOC will continue to follow.        Barriers to Discharge: Continued Medical Work up  Expected Discharge Plan and Services                                                 Social Determinants of Health (SDOH) Interventions    Readmission Risk Interventions     No data to display

## 2022-03-26 NOTE — Progress Notes (Signed)
  Progress Note   Patient: Luke Quinn:[id number] DOB: 06-01-1934 DOA: 03/25/2022     1 DOS: the patient was seen and examined on 03/26/2022   Brief hospital course: 88 y.o. male with medical history significant for Parkinson's disease, with suprapubic catheter, bedbound, prostate cancer, COPD, hypertension, atrial fibrillation. Pt with concerns of intermittent aspiration PTA. Chest imaging with findings suggestive of L sided PNA  Assessment and Plan: * PNA (pneumonia) -Noted to have fever of 99.8 at home.   -Ruled out for  sepsis.  Lactic acid 1.5 > 1.9.  -CT showing left lower lobe left upper lobe pneumonia.   -Family reports intermittent dysphagia/aspiration episodes while eating.  WBC 13.5 at presentation -cont IV ceftriaxone and azithromycin -Follow-up blood and urine cultures -WBC normalized  -Seen by SLP with recs for dysphagia 1 diet  Atrial fibrillation (HCC) Rate controlled on metoprolol and anticoagulation with Eliquis. -  Eliquis -Resume home meds  Acute metabolic encephalopathy Lethargic.  Likely due to pneumonia.  Patient's bedbound not ambulatory.  Family reports patient has not fallen.  No head CT or imaging on file. -Head CT reviewed, no acute process -IV antibiotics - 1/2 N/s 75cc/hr x 20hrs  Chronic indwelling Foley catheter Presenting with fever.  Follows regularly with urologist Dr. Felipa Eth.  Last urine culture 02/17/2022 grew vancomycin-resistant Enterococcus-sensitive only to quinupristin/dalfopristin.  Patient with history of colonization with Pseudomonas and VRE.  Followed up with ID 6/13 for this.  Parkinson's disease ([place]) Bedbound.  Currently with altered mental status.   - Cont Sinemet for now and other home medications  Essential hypertension Stable. -taking hydralazine, losartan, metoprolol, prazosin at home -For now, resume hydralazine, losartan, metoprolol  Chronic obstructive pulmonary disease ([place]) Stable       Subjective: Unable  to assess given mentation  Physical Exam: Vitals:   03/25/22 2214 03/26/22 0148 03/26/22 0630 03/26/22 1349  BP: (!) 144/98 136/81 (!) 134/93 (!) 139/95  Pulse: 69 63 86 92  Resp: 20 15 (!) 21 20  Temp: 98.2 F (36.8 C)  97.7 F (36.5 C) 98.6 F (37 C)  TempSrc: Oral  Oral Oral  SpO2: 98% 100% 100% 95%  Weight: 59 kg     Height: 6\' 3"  (1.905 m)      General exam: Awake, laying in bed, in nad Respiratory system: Normal respiratory effort, no wheezing Cardiovascular system: regular rate, s1, s2 Gastrointestinal system: Soft, nondistended, positive BS Central nervous system: CN2-12 grossly intact, strength intact Extremities: Perfused, no clubbing Skin: Normal skin turgor, no notable skin lesions seen Psychiatry: Mood normal // no visual hallucinations   Data Reviewed:  Labs reviewed: K 3.4, Cr 0.66, WBC 8.5  Family Communication: Pt in room, family at bedside  Disposition: Status is: Inpatient Remains inpatient appropriate because: Severity of illness  Planned Discharge Destination: Home with Home Health    Author: Marylu Lund, MD 03/26/2022 2:50 PM  For on call review [email or web address].

## 2022-03-26 NOTE — Plan of Care (Signed)
  Problem: Acute Rehab PT Goals(only PT should resolve) Goal: Pt will Roll Supine to Side Outcome: Progressing Flowsheets (Taken 03/26/2022 1334) Pt will Roll Supine to Side:  with max assist  with rail Goal: Pt/caregiver will Perform Home Exercise Program Outcome: Progressing Flowsheets (Taken 03/26/2022 1334) Pt/caregiver will Perform Home Exercise Program:  For increased ROM  For increased strengthening  With minimal assist   Pamala Hurry D. Hartnett-Rands, MS, PT Per [place] [phone] 03/26/2022

## 2022-03-26 NOTE — TOC Progression Note (Signed)
Transition of Care [facility]) - Progression Note    Patient Details  Name: Luke Quinn MRN: [id number] Date of Birth: Nov 06, 1933  Transition of Care [facility]) CM/SW Contact  Salome Arnt, [place] Phone Number: 03/26/2022, 8:33 AM  Clinical Narrative:   Transition of Care [facility]) Screening Note   Patient Details  Name: Luke Quinn Date of Birth: 08-Jun-1934   Transition of Care [facility]) CM/SW Contact:    Salome Arnt, LCSW Phone Number: 03/26/2022, 8:33 AM  [facility] notification completed. Notification ID: [id number].  Transition of Care Department [facility]) has reviewed patient and no TOC needs have been identified at this time. We will continue to monitor patient advancement through interdisciplinary progression rounds. If new patient transition needs arise, please place a TOC consult.            Expected Discharge Plan and Services                                                 Social Determinants of Health (SDOH) Interventions    Readmission Risk Interventions     No data to display

## 2022-03-26 NOTE — Evaluation (Signed)
Physical Therapy Evaluation Patient Details Name: Luke Quinn MRN: [id number] DOB: 1934-02-27 Today's Date: 03/26/2022  History of Present Illness  Luke Quinn is a 88 y.o. male with medical history significant for Parkinson's disease, with suprapubic catheter, bedbound, prostate cancer, COPD, hypertension, atrial fibrillation.   At the time of my evaluation patient is sleepy/lethargic.  Awakens to touch.  Daughter and spouse at bedside.  They report fever of 100.1.  He has a chronic cough that is unchanged.  He reports dry heaving but no actual vomiting.  No difficulty breathing.  No loose stools, no complaints of pain.  Reports over the past few days patient has been increasingly sleepy/lethargic.  He reports intermittent difficulties following and possible aspiration while eating.  He has been working with a Astronomer.  Patient has had prior UTIs, reported last urine culture about 5 to 6 weeks ago grew VRE and Pseudomonas.  With very few antibiotics to treat.  They were told that patient was colonized.  They deny any presence of bed ulcers or wounds.  Patient has not ambulated in at least a year.   Clinical Impression  Patient supine in bed with wife and youngest daughter present upon therapist arrival. Family members report patient has not ambulated in a year and has been bed bound. Wife reports she feels patient is getting tighter and is harder to bathe and reposition in hospital bed. Wife reports she does move his arms up and down to bend and straighten his elbows at times. Wife reports they do have a hospital bed at home. Patient is dependent for all mobility and ADLs at this time. Wife is primary caregiver with daughter assisting in care when she is not at work. Patient is able to assist with rolling but requires max assist. Patient exhibits bilateral shoulder contractures with known rotator cuff tear and pain in left shoulder. Patient also exhibits bilateral hip, knee and ankle  contractures.  Patient would continue to benefit from skilled physical therapy in current environment and next venue to continue return to prior mobility and educate caregivers on PROM and positioning program.        Recommendations for follow up therapy are one component of a multi-disciplinary discharge planning process, led by the attending physician.  Recommendations may be updated based on patient status, additional functional criteria and insurance authorization.  Follow Up Recommendations Home health PT      Assistance Recommended at Discharge Intermittent Supervision/Assistance  Patient can return home with the following  Two people to help with walking and/or transfers;A lot of help with bathing/dressing/bathroom;Help with stairs or ramp for entrance;Assist for transportation;Assistance with feeding;Assistance with cooking/housework    Equipment Recommendations None recommended by PT  Recommendations for Other Services       Functional Status Assessment Patient has had a recent decline in their functional status and demonstrates the ability to make significant improvements in function in a reasonable and predictable amount of time.     Precautions / Restrictions Precautions Precautions: Fall Restrictions Weight Bearing Restrictions: No      Mobility  Bed Mobility Overal bed mobility: Needs Assistance Bed Mobility: Rolling Rolling: Max assist      Transfers     General transfer comment: not attempted; bedbound x1 year    Ambulation/Gait     General Gait Details: not attempted; bedbound x1 year  Stairs      Wheelchair Mobility    Modified Rankin (Stroke Patients Only)       Balance  Pertinent Vitals/Pain Pain Assessment Pain Assessment: PAINAD Breathing: normal Negative Vocalization: none Facial Expression: smiling or inexpressive Body Language: relaxed Consolability: no need to console PAINAD Score: 0 Pain Intervention(s):  Limited activity within patient's tolerance, Monitored during session, Repositioned    Home Living Family/patient expects to be discharged to:: Private residence Living Arrangements: Spouse/significant other Available Help at Discharge: Family;Available 24 hours/day Type of Home: House         Home Layout: Able to live on main level with bedroom/bathroom Home Equipment: Hospital bed      Prior Function     Mobility Comments: bedbound for 1 year ADLs Comments: dependent for all ADLs     Hand Dominance        Extremity/Trunk Assessment   Upper Extremity Assessment Upper Extremity Assessment: RUE deficits/detail;LUE deficits/detail RUE Deficits / Details: decreased shoulder, elbow, wrist and finger ROM RUE: Shoulder pain with ROM RUE Coordination: decreased fine motor;decreased gross motor LUE: Shoulder pain with ROM LUE Coordination: decreased fine motor;decreased gross motor    Lower Extremity Assessment Lower Extremity Assessment: RLE deficits/detail;LLE deficits/detail RLE Deficits / Details: hip, knee and ankle contractures RLE Coordination: decreased fine motor;decreased gross motor LLE Deficits / Details: hip, knee and ankle contractures LLE Coordination: decreased fine motor;decreased gross motor    Cervical / Trunk Assessment Cervical / Trunk Assessment: Kyphotic  Communication   Communication: Expressive difficulties  Cognition Arousal/Alertness: Awake/alert Behavior During Therapy: WFL for tasks assessed/performed Overall Cognitive Status: Within Functional Limits for tasks assessed     General Comments: non-verbal        General Comments      Exercises General Exercises - Upper Extremity Shoulder Flexion: PROM, 5 reps, Supine, Both Shoulder ABduction: PROM, Both, Supine General Exercises - Lower Extremity Heel Slides: PROM, Both, 5 reps, Supine   Assessment/Plan    PT Assessment Patient needs continued PT services  PT Problem List  Decreased range of motion;Decreased strength;Decreased mobility;Decreased activity tolerance;Pain       PT Treatment Interventions Therapeutic exercise;Manual techniques;Functional mobility training;Patient/family education;Therapeutic activities    PT Goals (Current goals can be found in the Care Plan section)  Acute Rehab PT Goals Patient Stated Goal: Go home with HHPT for caregiver education to decrease caregiver load. PT Goal Formulation: With patient/family Time For Goal Achievement: 04/09/22 Potential to Achieve Goals: Fair    Frequency Min 2X/week        AM-PAC PT "6 Clicks" Mobility  Outcome Measure Help needed turning from your back to your side while in a flat bed without using bedrails?: A Lot Help needed moving from lying on your back to sitting on the side of a flat bed without using bedrails?: Total Help needed moving to and from a bed to a chair (including a wheelchair)?: Total Help needed standing up from a chair using your arms (e.g., wheelchair or bedside chair)?: Total Help needed to walk in hospital room?: Total Help needed climbing 3-5 steps with a railing? : Total 6 Click Score: 7    End of Session   Activity Tolerance: Patient limited by pain Patient left: in bed;with call bell/phone within reach;with family/visitor present (with SLP present) Nurse Communication: Mobility status PT Visit Diagnosis: Muscle weakness (generalized) (M62.81);Pain Pain - Right/Left: Left Pain - part of body: Shoulder;Knee    Time: 1145-1210 PT Time Calculation (min) (ACUTE ONLY): 25 min   Charges:   PT Evaluation $PT Eval Low Complexity: 1 Low PT Treatments $Self Care/Home Management: 8-22  Floria Raveling. Hartnett-Rands, MS, PT Per [place] [phone]  Pamala Hurry  Hartnett-Rands 03/26/2022, 1:27 PM

## 2022-03-26 NOTE — Progress Notes (Signed)
MD made aware of pts soft BP, new orders placed by MD.

## 2022-03-26 NOTE — Progress Notes (Signed)
Initial Nutrition Assessment  DOCUMENTATION CODES:   Underweight  INTERVENTION:  Diet per SLP recommendation, DYS 1 with thin liquids, encourage PO intake Nursing staff to feed patient Increase Ensure Enlive po TID, each supplement provides 350 kcal and 20 grams of protein. Magic cup TID with meals, each supplement provides 290 kcal and 9 grams of protein MVI with minerals  NUTRITION DIAGNOSIS:   Inadequate oral intake related to poor appetite as evidenced by per patient/family report.  GOAL:   Patient will meet greater than or equal to 90% of their needs  MONITOR:   PO intake, Supplement acceptance, Weight trends, Labs  REASON FOR ASSESSMENT:  Malnutrition Screening Tool    ASSESSMENT:   Pt with hx of atrial fibrillation, CAD, hx prostate and lung cancer, COPD, HTN, and parkinson's disease presented to ED with nausea and vomiting with poor PO at home.  At baseline pt is bed bound and had AMS on admission with limited verbal ability.  Unable to reach pt on room phone at this time. Discussed nutrition plan with RN, states that SLP is currently evaluating and recommends DYS 1 diet with thin liquids. Supplements in place from RN admission screening.   Pt is underweight and has had a 8.4% weight loss in the last 3 months (4/28-7/11) which is severe.  Nutritionally Relevant Medications: Scheduled Meds:  Ensure Enlive  237 mL Oral TID BM   potassium chloride  40 mEq Oral Once   Continuous Infusions:  sodium chloride 75 mL/hr at 03/25/22 2314   azithromycin 500 mg (03/26/22 0031)   cefTRIAXone (ROCEPHIN)  IV     vancomycin 750 mg (03/26/22 0545)   PRN Meds: ondansetron, polyethylene glycol  Labs Reviewed: K 3.4 Chloride 113 CBG ranges from 96-111 mg/dL over the last 24 hours  NUTRITION - FOCUSED PHYSICAL EXAM: Defer to in-person assessment  Diet Order:   Diet Order             DIET - DYS 1 Room service appropriate? Yes; Fluid consistency: Thin  Diet effective  now                   EDUCATION NEEDS:   Not appropriate for education at this time  Skin:  Skin Assessment: Reviewed RN Assessment  Last BM:  7/10  Height:   Ht Readings from Last 1 Encounters:  03/25/22 6\' 3"  (1.905 m)    Weight:   Wt Readings from Last 1 Encounters:  03/25/22 59 kg    Ideal Body Weight:  89.1 kg  BMI:  Body mass index is 16.26 kg/m.  Estimated Nutritional Needs:   Kcal:  2100-2300 kcal/d  Protein:  105-120 g/d  Fluid:  2.1-2.3 L/d    Ranell Patrick, RD, LDN Clinical Dietitian RD pager # available in [place]  After hours/weekend pager # available in [facility]

## 2022-03-26 NOTE — Hospital Course (Signed)
88 y.o. male with medical history significant for Parkinson's disease, with suprapubic catheter, bedbound, prostate cancer, COPD, hypertension, atrial fibrillation. Pt with concerns of intermittent aspiration PTA. Chest imaging with findings suggestive of L sided PNA

## 2022-03-26 NOTE — Evaluation (Signed)
Clinical/Bedside Swallow Evaluation Patient Details  Name: Luke Quinn MRN: [id number] Date of Birth: 05/08/1934  Today's Date: 03/26/2022 Time: SLP Start Time (ACUTE ONLY): 1301 SLP Stop Time (ACUTE ONLY): 1332 SLP Time Calculation (min) (ACUTE ONLY): 31 min  Past Medical History:  Past Medical History:  Diagnosis Date   AAA (abdominal aortic aneurysm) ([place])    status post EVAR November 2016.   Atrial fibrillation [facility]) 10/2020   Feb 2022, started on Eliquis   CAD (coronary artery disease)    multiple cardiac stents placed years ago.   Cancer [facility])    prostate   COPD (chronic obstructive pulmonary disease) ([facility])    Depression    Hypertension    Lung cancer ([place])    right;  s/p SBRT treatment completed in November 2017   Macular degeneration, age related    Ocular hypertension    Parkinson's disease ([place])    Pulmonary embolism ([place])    Feb 2022, started on Eliquis   PVD (peripheral vascular disease) ([place])    Shingles    Past Surgical History:  Past Surgical History:  Procedure Laterality Date   cardiac stents     IR CATHETER TUBE CHANGE  01/10/2022   PROSTATE SURGERY     HPI:  Luke Quinn is a 88 y.o. male with medical history significant for Parkinson's disease, with suprapubic catheter, bedbound, prostate cancer, COPD, hypertension, atrial fibrillation. Reports over the past few days patient has been increasingly sleepy/lethargic. He reports intermittent difficulties following and possible aspiration while eating. He has been working with a Astronomer. Patient has not ambulated in at least a year. CT chest abdomen and pelvis with contrast-suggest patchy consolidation in left lower lobe and left upper lobe suspicious for pneumonia. BSE requested.    Assessment / Plan / Recommendation  Clinical Impression  Clinical swallow evaluation completed with Pt upright in bed and family present. Pt typically consumes soft textures and thin liquids via straw sips at home  with feeder assist in bed. Pt alert, but not verbalizing at this time. Pt with mild generalized oral weakness. Pt assessed with ice chips, thin water via spoon and straw, puree, and mech soft textures. Pt with slow oral prep and mastication with mech soft and delayed cough, however Pt with some couging prior to any po given. RN arrived and Pt able to swallow his pills whole one at a time with water. Will proceed with D1/puree and thin liquids with feeder assist and po medication whole with water when alert and upright, continue oral care. SLP will check back tomorrow AM and  consider MBSS while in acute stay for baseline measurement. Family is in agreement with plan of care. SLP Visit Diagnosis: Dysphagia, unspecified (R13.10)    Aspiration Risk  Mild aspiration risk;Risk for inadequate nutrition/hydration    Diet Recommendation Dysphagia 1 (Puree);Thin liquid   Liquid Administration via: Straw;Cup;Spoon Medication Administration: Whole meds with liquid Supervision: Staff to assist with self feeding;Full supervision/cueing for compensatory strategies Compensations: Slow rate;Small sips/bites Postural Changes: Seated upright at 90 degrees;Remain upright for at least 30 minutes after po intake    Other  Recommendations Oral Care Recommendations: Oral care before and after PO;Staff/trained caregiver to provide oral care Other Recommendations: Clarify dietary restrictions    Recommendations for follow up therapy are one component of a multi-disciplinary discharge planning process, led by the attending physician.  Recommendations may be updated based on patient status, additional functional criteria and insurance authorization.  Follow up Recommendations Home health  SLP      Assistance Recommended at Discharge Frequent or constant Supervision/Assistance  Functional Status Assessment Patient has had a recent decline in their functional status and demonstrates the ability to make significant  improvements in function in a reasonable and predictable amount of time.  Frequency and Duration min 2x/week  1 week       Prognosis Prognosis for Safe Diet Advancement: Fair Barriers to Reach Goals: Severity of deficits      Swallow Study   General Date of Onset: 03/25/22 HPI: Luke Quinn is a 88 y.o. male with medical history significant for Parkinson's disease, with suprapubic catheter, bedbound, prostate cancer, COPD, hypertension, atrial fibrillation. Reports over the past few days patient has been increasingly sleepy/lethargic. He reports intermittent difficulties following and possible aspiration while eating. He has been working with a Astronomer. Patient has not ambulated in at least a year. CT chest abdomen and pelvis with contrast-suggest patchy consolidation in left lower lobe and left upper lobe suspicious for pneumonia. BSE requested. Type of Study: Bedside Swallow Evaluation Previous Swallow Assessment: N/A Diet Prior to this Study: NPO Temperature Spikes Noted: No Respiratory Status: Nasal cannula History of Recent Intubation: No Behavior/Cognition: Alert;Cooperative;Requires cueing Oral Cavity Assessment: Dry Oral Care Completed by SLP: Yes Oral Cavity - Dentition: Dentures, top;Dentures, bottom Vision: Impaired for self-feeding Self-Feeding Abilities: Total assist Patient Positioning: Upright in bed Baseline Vocal Quality: Not observed Volitional Cough: Strong Volitional Swallow: Unable to elicit    Oral/Motor/Sensory Function Overall Oral Motor/Sensory Function: Generalized oral weakness   Ice Chips Ice chips: Within functional limits Presentation: Spoon   Thin Liquid Thin Liquid: Within functional limits Presentation: Spoon;Straw    Nectar Thick Nectar Thick Liquid: Not tested   Honey Thick Honey Thick Liquid: Not tested   Puree Puree: Within functional limits Presentation: Spoon   Solid     Solid: Impaired Presentation: Spoon Oral Phase  Impairments: Impaired mastication Oral Phase Functional Implications: Prolonged oral transit Pharyngeal Phase Impairments: Cough - Delayed     Thank you,  Genene Churn, [place]  Ellianah Cordy 03/26/2022,1:37 PM

## 2022-03-27 DIAGNOSIS — G9341 Metabolic encephalopathy: Secondary | ICD-10-CM | POA: Diagnosis not present

## 2022-03-27 DIAGNOSIS — Z978 Presence of other specified devices: Secondary | ICD-10-CM | POA: Diagnosis not present

## 2022-03-27 LAB — COMPREHENSIVE METABOLIC PANEL WITH GFR
ALT: <5 U/L (ref 0–44)
AST: 9 U/L — ABNORMAL LOW (ref 15–41)
Albumin: 2.5 g/dL — ABNORMAL LOW (ref 3.5–5.0)
Alkaline Phosphatase: 69 U/L (ref 38–126)
Anion gap: 4 — ABNORMAL LOW (ref 5–15)
BUN: 24 mg/dL — ABNORMAL HIGH (ref 8–23)
CO2: 24 mmol/L (ref 22–32)
Calcium: 9.8 mg/dL (ref 8.9–10.3)
Chloride: 113 mmol/L — ABNORMAL HIGH (ref 98–111)
Creatinine, Ser: 0.55 mg/dL — ABNORMAL LOW (ref 0.61–1.24)
GFR, Estimated: >60 mL/min (ref >60)
Glucose, Bld: 98 mg/dL (ref 70–99)
Potassium: 3.3 mmol/L — ABNORMAL LOW (ref 3.5–5.1)
Sodium: 141 mmol/L (ref 135–145)
Total Bilirubin: 0.6 mg/dL (ref 0.3–1.2)
Total Protein: 5.2 g/dL — ABNORMAL LOW (ref 6.5–8.1)

## 2022-03-27 LAB — CBC
HCT: 29.9 % — ABNORMAL LOW (ref 39.0–52.0)
Hemoglobin: 9.1 g/dL — ABNORMAL LOW (ref 13.0–17.0)
MCH: 28.5 pg (ref 26.0–34.0)
MCHC: 30.4 g/dL (ref 30.0–36.0)
MCV: 93.7 fL (ref 80.0–100.0)
Platelets: 182 10*3/uL (ref 150–400)
RBC: 3.19 MIL/uL — ABNORMAL LOW (ref 4.22–5.81)
RDW: 15.9 % — ABNORMAL HIGH (ref 11.5–15.5)
WBC: 8.1 10*3/uL (ref 4.0–10.5)
nRBC: 0 % (ref 0.0–0.2)

## 2022-03-27 MED ORDER — CEFDINIR 300 MG PO CAPS: 300.0000 mg | ORAL_CAPSULE | Freq: Two times a day (BID) | ORAL | 0 refills | Status: AC

## 2022-03-27 MED ORDER — AZITHROMYCIN 250 MG PO TABS: ORAL_TABLET | ORAL | 0 refills | Status: AC

## 2022-03-27 MED ORDER — POTASSIUM CHLORIDE CRYS ER 20 MEQ PO TBCR
60.0000 meq | EXTENDED_RELEASE_TABLET | Freq: Once | ORAL | Status: AC
  Administered 2022-03-27: 60 meq via ORAL
  Filled 2022-03-27: qty 3

## 2022-03-27 NOTE — Progress Notes (Signed)
Dr. Jearld Lesch notified through secure chat that patient family refusing Senimet and Metoprolol due to concern of BP dropping during the night.

## 2022-03-27 NOTE — Progress Notes (Signed)
Speech Language Pathology Treatment: Dysphagia  Patient Details Name: Luke Quinn MRN: [id number] DOB: 1934/08/14 Today's Date: 03/27/2022 Time: 8257-4935 SLP Time Calculation (min) (ACUTE ONLY): 30 min  Assessment / Plan / Recommendation Clinical Impression  Pt seen for ongoing dysphagia intervention following BSE completed yesterday. Plan was for possible MBSS today, however family reports that Pt is back to his baseline. Pt consumed a tomato sandwich and straw sips thin water without signs or symptoms of aspiration and no reports of globus. His family reports occasional coughing with thin water via straw and they were encouraged to try using a coffee stirrer straw as it is more narrow and will help control the amount taken. SLP also reiterated the importance of oral care, sitting upright for all po, and encouraging a strong cough to help prevent aspiration PNA. Family indicates that he has not had PNA lately until this recent episode. They do not wish to pursue MBSS at this time, which is reasonable. They were given my contact information should they have further questions and concerns. Pt is leaving to go home today, advance to D3/mech soft and thin liquids with aspiration and reflux precautions and continue good oral care. Pt and family are in agreement with plan of care. SLP will sign off.     HPI HPI: Luke Quinn is a 88 y.o. male with medical history significant for Parkinson's disease, with suprapubic catheter, bedbound, prostate cancer, COPD, hypertension, atrial fibrillation. Reports over the past few days patient has been increasingly sleepy/lethargic. He reports intermittent difficulties following and possible aspiration while eating. He has been working with a Astronomer. Patient has not ambulated in at least a year. CT chest abdomen and pelvis with contrast-suggest patchy consolidation in left lower lobe and left upper lobe suspicious for pneumonia. BSE requested.      SLP  Plan  All goals met;Discharge SLP treatment due to (comment)      Recommendations for follow up therapy are one component of a multi-disciplinary discharge planning process, led by the attending physician.  Recommendations may be updated based on patient status, additional functional criteria and insurance authorization.    Recommendations  Diet recommendations: Dysphagia 3 (mechanical soft);Thin liquid Liquids provided via: Cup;Straw Medication Administration: Whole meds with liquid Supervision: Staff to assist with self feeding;Full supervision/cueing for compensatory strategies Compensations: Slow rate;Small sips/bites Postural Changes and/or Swallow Maneuvers: Seated upright 90 degrees;Upright 30-60 min after meal                Oral Care Recommendations: Oral care BID;Staff/trained caregiver to provide oral care Follow Up Recommendations: No SLP follow up Assistance recommended at discharge: Frequent or constant Supervision/Assistance SLP Visit Diagnosis: Dysphagia, unspecified (R13.10) Plan: All goals met;Discharge SLP treatment due to (comment)         Thank you,  Genene Churn, [place]   [place]  03/27/2022, 12:59 PM

## 2022-03-27 NOTE — Discharge Summary (Signed)
Physician Discharge Summary   Patient: Luke Quinn MRN: [id number] DOB: 1933/10/17  Admit date:     03/25/2022  Discharge date: 03/27/22  Discharge Physician: Marylu Lund   PCP: [facility]   Recommendations at discharge:    Follow up with PCP in 1-2 weeks  Discharge Diagnoses: Principal Problem:   PNA (pneumonia) Active Problems:   Chronic obstructive pulmonary disease (HCC)   Essential hypertension   Parkinson's disease ([place])   Chronic indwelling Foley catheter   Acute metabolic encephalopathy   Atrial fibrillation ([place])  Resolved Problems:   * No resolved hospital problems. *  Hospital Course: 88 y.o. male with medical history significant for Parkinson's disease, with suprapubic catheter, bedbound, prostate cancer, COPD, hypertension, atrial fibrillation. Pt with concerns of intermittent aspiration PTA. Chest imaging with findings suggestive of L sided PNA  Assessment and Plan: * PNA (pneumonia) -Noted to have fever of 99.8 at home.   -Ruled out for  sepsis.  Lactic acid 1.5 > 1.9.  -CT showing left lower lobe left upper lobe pneumonia.   -Family reports intermittent dysphagia/aspiration episodes while eating.  WBC 13.5 at presentation, trended down to normal with abx -patient was continued on IV ceftriaxone and azithromycin -Seen by SLP with recs for dysphagia 1 diet -Discharged on omnicef and azithromycin to complete course of abx   Atrial fibrillation ([place]) Rate controlled on metoprolol and anticoagulation with Eliquis. -  Eliquis -Resumed home meds   Acute metabolic encephalopathy Lethargic.  Likely due to pneumonia.  Patient's bedbound not ambulatory.  Family reports patient has not fallen.  No head CT or imaging on file. -Head CT reviewed, no acute process -given IV antibiotics per above with po abx on discharge -mentation improved   Chronic indwelling Foley catheter Presenting with fever.  Follows regularly with urologist Dr. Felipa Eth.  Last  urine culture 02/17/2022 grew vancomycin-resistant Enterococcus-sensitive only to quinupristin/dalfopristin.  Patient with history of colonization with Pseudomonas and VRE.  Followed up with ID 6/13 for this.   Parkinson's disease ([place]) Bedbound.  Currently with altered mental status.   - Cont Sinemet for now and other home medications   Essential hypertension Remained stable. -taking hydralazine, losartan, metoprolol, prazosin at home -For now, resume hydralazine, losartan, metoprolol   Chronic obstructive pulmonary disease ([place]) Stable         Consultants:  Procedures performed:   Disposition: Home Diet recommendation:  Dysphagia 1  DISCHARGE MEDICATION: Allergies as of 03/27/2022       Reactions   Gabapentin    Pt is unable to maintain his balance while taking this med   Atorvastatin    Ankle swelling        Medication List     STOP taking these medications    hydrALAZINE 100 MG tablet Commonly known as: APRESOLINE   losartan 100 MG tablet Commonly known as: COZAAR       TAKE these medications    acetaminophen 500 MG tablet Commonly known as: TYLENOL Take 500 mg by mouth 2 (two) times daily.   albuterol (2.5 MG/3ML) 0.083% nebulizer solution Commonly known as: PROVENTIL Take 2.5 mg by nebulization every 6 (six) hours as needed for wheezing or shortness of breath.   apixaban 5 MG Tabs tablet Commonly known as: ELIQUIS Take 1 tablet (5 mg total) by mouth 2 (two) times daily.   azithromycin 250 MG tablet Commonly known as: Zithromax 1 tab po daily x 7 more days, zero refills   carbidopa-levodopa 25-100 MG tablet Commonly known  as: SINEMET IR Take 2 tablets by mouth 5 (five) times daily.   carbidopa-levodopa 50-200 MG tablet Commonly known as: SINEMET CR Take 1 tablet by mouth at bedtime.   carboxymethylcellulose 1 % ophthalmic solution Place 1 drop into both eyes 5 (five) times daily.   cefdinir 300 MG capsule Commonly known as:  OMNICEF Take 1 capsule (300 mg total) by mouth 2 (two) times daily for 7 days.   cholecalciferol 25 MCG (1000 UNIT) tablet Commonly known as: VITAMIN D3 Take 1,000 Units by mouth daily.   diclofenac Sodium 1 % Gel Commonly known as: VOLTAREN Apply 1 application. topically 4 (four) times daily as needed (pain).   ENSURE PO Take 1 Container by mouth in the morning, at noon, in the evening, and at bedtime.   famotidine 20 MG tablet Commonly known as: PEPCID Take 20 mg by mouth daily.   ferrous sulfate 325 (65 FE) MG tablet Take 325 mg by mouth every Monday, Wednesday, and Friday.   furosemide 20 MG tablet Commonly known as: LASIX Take 10 mg by mouth daily.   guaifenesin 400 MG Tabs tablet Commonly known as: HUMIBID E Take 400 mg by mouth 2 (two) times daily.   ICAPS AREDS 2 PO Take 1 capsule by mouth 2 (two) times daily.   latanoprost 0.005 % ophthalmic solution Commonly known as: XALATAN Place 1 drop into both eyes at bedtime.   lidocaine 5 % Commonly known as: LIDODERM Place 1 patch onto the skin daily as needed (back pain).   lidocaine 4 % cream Commonly known as: LMX Apply 1 application. topically 3 (three) times daily as needed (back pain).   Lubricant Eye Oint Place 1 application. into both eyes at bedtime.   Melatonin 5 MG Caps Take 5 mg by mouth at bedtime.   metoprolol tartrate 50 MG tablet Commonly known as: LOPRESSOR Take 25 mg by mouth 2 (two) times daily.   nystatin cream Commonly known as: MYCOSTATIN Apply 1 application. topically daily.   polyethylene glycol 17 g packet Commonly known as: MIRALAX / GLYCOLAX Take 17 g by mouth daily.   potassium chloride SA 20 MEQ tablet Commonly known as: KLOR-CON M Take 20 mEq by mouth 2 (two) times daily.   prazosin 1 MG capsule Commonly known as: MINIPRESS Take 1 mg by mouth at bedtime.   rasagiline 1 MG Tabs tablet Commonly known as: AZILECT Take 1 tablet by mouth daily.        [facility] Follow up in 2 week(s).   Specialty: General Practice Why: Hospital follow up Contact information: [place] [address] [phone]         Herminio Commons, MD .   Specialty: Cardiology Contact information: [place] [address] [phone]                Discharge Exam: Danley Danker Weights   03/25/22 1749 03/25/22 1817 03/25/22 2214  Weight: 61.5 kg 61.2 kg 59 kg   General exam: Conversant, in no acute distress Respiratory system: normal chest rise, clear, no audible wheezing Cardiovascular system: regular rhythm, s1-s2 Gastrointestinal system: Nondistended, nontender, pos BS Central nervous system: No seizures, no tremors Extremities: No cyanosis, no joint deformities Skin: No rashes, no pallor Psychiatry: Affect normal // no auditory hallucinations   Condition at discharge: fair  The results of significant diagnostics from this hospitalization (including imaging, microbiology, ancillary and laboratory) are listed below for reference.  Imaging Studies: DG Abd 1 View  Result Date: 03/26/2022 CLINICAL DATA:  Abdominal pain. EXAM: ABDOMEN - 1 VIEW COMPARISON:  CT without contrast 11/22/2021. FINDINGS: The bowel gas pattern is nonobstructive. No radio-opaque calculi or other significant radiographic abnormality are seen. There were multiple bilateral punctate nonobstructive caliceal stones in both kidneys on the CT but they are not visible radiographically. The aorta and common iliac arteries are heavily calcified. An aortic stent graft repair is again noted. The excluded aneurysm sac is faintly visible to the left of the graft indicated by a thin curvilinear calcification at the level L3-4. The rectum is no longer dilated with stool. There is moderate retained stool in the ascending colon but no dilated segments of small or large bowel. Numerous surgical clips are noted in the pelvic sidewalls  and floor from prior radical prostatectomy. Visceral shadows are stable. Levoscoliosis and advanced degenerative change again noted lumbar spine with osteopenia. Chronic change left lung base. IMPRESSION: 1. Constipation without evidence of small-bowel obstruction. There was stool dilatation of the rectum on the prior study which is not seen today. 2. Punctate nonobstructive caliceal stones were noted in both kidneys on CT but are not visible radiographically. No new pathologic calcification. 3. Aortic atherosclerosis with AAA stent graft repair. Electronically Signed   By: Telford Nab M.D.   On: 03/26/2022 20:59   CT HEAD WO CONTRAST (5MM)  Result Date: 03/26/2022 CLINICAL DATA:  Initial evaluation for acute altered mental status, lethargy. EXAM: CT HEAD WITHOUT CONTRAST TECHNIQUE: Contiguous axial images were obtained from the base of the skull through the vertex without intravenous contrast. RADIATION DOSE REDUCTION: This exam was performed according to the departmental dose-optimization program which includes automated exposure control, adjustment of the mA and/or kV according to patient size and/or use of iterative reconstruction technique. COMPARISON:  None Available. FINDINGS: Brain: Diffuse prominence of the CSF containing spaces frontal generalized cerebral atrophy. Patchy and confluent hypodensity involving the periventricular deep white matter both cerebral hemispheres, most consistent with chronic small vessel ischemic disease, moderately advanced in nature. No acute intracranial hemorrhage. No acute large vessel territory infarct. No mass lesion, mass effect or midline shift. Diffuse ventricular prominence is seen, somewhat out of proportion to cortical sulcation. While this could be related underlying atrophy, a degree of NPH could be contributory. No extra-axial fluid collection. Vascular: No abnormal hyperdense vessel. Calcified atherosclerosis present at the skull base. Skull: Scalp soft  tissues demonstrate no acute finding. Calvarium intact. Sinuses/Orbits: Globes orbital soft tissues demonstrate no acute finding. Small air-fluid level with pneumatized secretions present within the sphenoid sinuses. Paranasal sinuses are otherwise clear. No mastoid effusion. Other: None. IMPRESSION: 1. No acute intracranial abnormality. 2. Generalized cerebral atrophy with moderate chronic small vessel ischemic disease. 3. Diffuse ventricular prominence, somewhat out of proportion to cortical sulcation. While this could be related underlying atrophy, a degree of NPH could be contributory, and could be considered in the correct clinical setting. 4. Sphenoid sinusitis. Electronically Signed   By: Jeannine Boga M.D.   On: 03/26/2022 02:17   CT CHEST ABDOMEN PELVIS W CONTRAST  Result Date: 03/25/2022 CLINICAL DATA:  Fever sepsis history of right lung cancer prostate cancer EXAM: CT CHEST, ABDOMEN, AND PELVIS WITH CONTRAST TECHNIQUE: Multidetector CT imaging of the chest, abdomen and pelvis was performed following the standard protocol during bolus administration of intravenous contrast. RADIATION DOSE REDUCTION: This exam was performed according to the departmental dose-optimization program which includes automated exposure control, adjustment of the mA and/or kV  according to patient size and/or use of iterative reconstruction technique. CONTRAST:  166mL OMNIPAQUE IOHEXOL 300 MG/ML  SOLN COMPARISON:  Chest x-ray 03/25/2022, CT 10/23/2020, 11/22/2021 02/21/2022 FINDINGS: CT CHEST FINDINGS Cardiovascular: Advanced aortic atherosclerosis. Ectatic ascending aorta up to 3.9 cm. No dissection is seen. Coronary vascular calcification. Borderline to mild cardiomegaly. No pericardial effusion. Mediastinum/Nodes: Midline trachea. 2.5 cm hypodense left thyroid nodule. Borderline low paratracheal node measuring 9 mm. Esophagus within normal limits. Lungs/Pleura: Heterogeneous ground-glass density and patchy  consolidations in the left lower lobe and posterior left upper lobe suspicious for a pneumonia. Curvilinear somewhat bandlike density in the right upper lobe extending to pleural surface, grossly stable as compared with chest CT from February 2022. Subpleural right middle lobe nodular density measuring 15 mm, previously 15 mm. Posterior right lower lobe subpleural nodular density measuring 2 cm, previously 2.2 cm. Musculoskeletal: Sternum is intact. There are degenerative changes of the spine. No acute osseous abnormality. CT ABDOMEN PELVIS FINDINGS Hepatobiliary: No calcified gallstone. No focal hepatic abnormality. No biliary dilatation. Pancreas: Unremarkable. No pancreatic ductal dilatation or surrounding inflammatory changes. Spleen: Normal in size without focal abnormality. Adrenals/Urinary Tract: Right adrenal gland is stable. Stable 15 mm left adrenal nodule consistent with adenoma. Kidneys show no hydronephrosis. Small bilateral kidney stones. Bilateral renal cysts. Multiple slightly dense lesions in the left kidney, too small to further characterize, no specific follow-up imaging is recommended. Suprapubic catheter within the bladder. Bladder appears thick walled. Stomach/Bowel: The stomach is nonenlarged. No dilated small bowel. No acute bowel wall thickening. Negative appendix. Diverticular disease of the left colon without acute inflammatory process. Vascular/Lymphatic: Advanced aortic atherosclerosis. Status post aortic stent graft for aneurysm. Residual sac diameter measures 6.1 by 5.4 cm, previously 6.1 x 5.3 cm when measured in similar fashion. Patchy enhancement within the aneurysm sac on delayed views, consistent with an endoleak. Reproductive: Prostatectomy Other: Negative for pelvic effusion or free air Musculoskeletal: No acute or suspicious osseous abnormality IMPRESSION: 1. Heterogeneous ground-glass density and patchy consolidations in the left lower lobe and posterior left upper lobe  suspicious for pneumonia. 2. Somewhat bandlike density in the right apical upper lobe extending to pleural surface potentially due to scarring, stable since February 2022 but new compared to radiographs prior to this. Stable subpleural nodular densities at the right middle lobe and right base also stable since 2022, favoring a benign process, but consider 1 year follow-up to ensure continued stability. 3. Status post aortic stent graft for infrarenal abdominal aortic aneurysm. Residual sac diameter up to 6.1 cm, grossly unchanged as compared with March 2023; globular enhancement within the aneurysm sac on delayed views, consistent with an endoleak. No signs of retroperitoneal hematoma or stranding. Vascular surgery follow-up is recommended. 4. Nonobstructing bilateral kidney stones. Interval placement of suprapubic catheter. Thick-walled appearance of the urinary bladder. 5. 2.5 cm hypodense nodule in the left lobe of the thyroid. Recommend thyroid US as clinically indicated. (Ref: J Am Coll Radiol. 2015 Feb;12(2): 143-50).This should be performed on a nonemergent basis. Electronically Signed   By: Donavan Foil M.D.   On: 03/25/2022 19:27   DG Chest Port 1 View  Result Date: 03/25/2022 CLINICAL DATA:  Possible sepsis EXAM: PORTABLE CHEST 1 VIEW COMPARISON:  02/25/2022 FINDINGS: Transverse diameter of hot is increased. There are no signs of pulmonary edema. There is new linear density in right apical region. Increased markings are seen in left lower lung field. There is no pleural effusion or pneumothorax. IMPRESSION: Cardiomegaly. There are no signs of pulmonary edema.  There is a linear density in the right apical region. Increased markings are seen in left lower lung field. Findings suggest subsegmental atelectasis or pneumonia. Electronically Signed   By: Elmer Picker M.D.   On: 03/25/2022 16:51   DG Chest Port 1 View  Result Date: 02/25/2022 CLINICAL DATA:  Atrial fibrillation with chest pain,  history of Parkinson's. EXAM: PORTABLE CHEST 1 VIEW COMPARISON:  February 21, 2022. FINDINGS: Image rotated the LEFT, limited by patient positioning related to patient condition by report. EKG leads project over the chest. No lobar consolidation. Stable scarring in the RIGHT upper lobe. No sign of pleural effusion or pneumothorax. Osteopenia without acute skeletal process. IMPRESSION: No active cardiopulmonary disease. Electronically Signed   By: Zetta Bills M.D.   On: 02/25/2022 19:07    Microbiology: Results for orders placed or performed during the hospital encounter of 03/25/22  Blood Culture (routine x 2)     Status: None (Preliminary result)   Collection Time: 03/25/22  4:01 PM   Specimen: BLOOD RIGHT FOREARM  Result Value Ref Range Status   Specimen Description BLOOD RIGHT FOREARM  Final   Special Requests   Final    BOTTLES DRAWN AEROBIC AND ANAEROBIC Blood Culture adequate volume   Culture   Final    NO GROWTH 2 DAYS Performed at [facility], [address] [place], [place] [address]    Report Status PENDING  Incomplete  Blood Culture (routine x 2)     Status: None (Preliminary result)   Collection Time: 03/25/22  4:15 PM   Specimen: Left Antecubital; Blood  Result Value Ref Range Status   Specimen Description LEFT ANTECUBITAL  Final   Special Requests   Final    BOTTLES DRAWN AEROBIC AND ANAEROBIC Blood Culture results may not be optimal due to an excessive volume of blood received in culture bottles   Culture   Final    NO GROWTH 2 DAYS Performed at [facility], [address], [place], [place] [address]    Report Status PENDING  Incomplete  Urine Culture     Status: Abnormal   Collection Time: 03/25/22  6:13 PM   Specimen: Urine, Clean Catch  Result Value Ref Range Status   Specimen Description   Final    URINE, CLEAN CATCH Performed at [facility], [address], [place], [place] [address]    Special Requests   Final    NONE Performed at [facility], [address], [place], [place] [address]    Culture MULTIPLE SPECIES PRESENT, SUGGEST RECOLLECTION (A)  Final   Report Status 03/26/2022 FINAL  Final  SARS Coronavirus 2 by RT PCR (hospital order, performed in [facility] hospital lab) *cepheid single result test* Anterior Nasal Swab     Status: None   Collection Time: 03/26/22 10:22 AM   Specimen: Anterior Nasal Swab  Result Value Ref Range Status   SARS Coronavirus 2 by RT PCR NEGATIVE NEGATIVE Final    Comment: (NOTE) SARS-CoV-2 target nucleic acids are NOT DETECTED.  The SARS-CoV-2 RNA is generally detectable in upper and lower respiratory specimens during the acute phase of infection. The lowest concentration of SARS-CoV-2 viral copies this assay can detect is 250 copies / mL. A negative result does not preclude SARS-CoV-2 infection and should not be used as the sole basis for treatment or other patient management decisions.  A negative result may occur with improper specimen collection / handling, submission of specimen other than nasopharyngeal swab, presence of viral mutation(s)  within the areas targeted by this assay, and inadequate number of viral copies (<250 copies / mL). A negative result must be combined with clinical observations, patient history, and epidemiological information.  Fact Sheet for Patients:   https://www.patel.info/  Fact Sheet for Healthcare Providers: https://hall.com/  This test is not yet approved or  cleared by the [place] FDA and has been authorized for detection and/or diagnosis of SARS-CoV-2 by FDA under an Emergency Use Authorization (EUA).  This EUA will remain in effect (meaning this test can be used) for the duration of the COVID-19 declaration under Section 564(b)(1) of the Act, 21 U.S.C. section 360bbb-3(b)(1), unless the authorization is terminated or revoked sooner.  Performed at [facility], [address] [place], [place] [address]      Labs: CBC: Recent Labs  Lab 03/25/22 1615 03/26/22 0613 03/27/22 0537  WBC 13.5* 8.5 8.1  NEUTROABS 12.1*  --   --   HGB 11.0* 9.9* 9.1*  HCT 35.4* 32.3* 29.9*  MCV 91.9 94.7 93.7  PLT 233 197 638   Basic Metabolic Panel: Recent Labs  Lab 03/25/22 1615 03/26/22 0613 03/27/22 0537  NA 144 143 141  K 3.5 3.4* 3.3*  CL 113* 113* 113*  CO2 25 25 24   GLUCOSE 126* 96 98  BUN 23 22 24*  CREATININE 0.73 0.66 0.55*  CALCIUM 11.1* 10.3 9.8   Liver Function Tests: Recent Labs  Lab 03/25/22 1615 03/27/22 0537  AST 12* 9*  ALT 7 <5  ALKPHOS 87 69  BILITOT 0.9 0.6  PROT 6.4* 5.2*  ALBUMIN 3.1* 2.5*   CBG: Recent Labs  Lab 03/26/22 0056 03/26/22 1113  GLUCAP 111* 101*    Discharge time spent: less than 30 minutes.  Signed: Marylu Lund, MD [facility] 03/27/2022

## 2022-03-30 LAB — CULTURE, BLOOD (ROUTINE X 2)
Culture: NO GROWTH
Culture: NO GROWTH
Special Requests: ADEQUATE

## 2022-04-10 ENCOUNTER — Ambulatory Visit (INDEPENDENT_AMBULATORY_CARE_PROVIDER_SITE_OTHER): Payer: No Typology Code available for payment source | Admitting: Urology

## 2022-04-10 ENCOUNTER — Encounter: Payer: Self-pay | Admitting: Urology

## 2022-04-10 VITALS — BP 94/55 | HR 61 | Ht 75.0 in | Wt 130.0 lb

## 2022-04-10 DIAGNOSIS — Z9359 Other cystostomy status: Secondary | ICD-10-CM | POA: Diagnosis not present

## 2022-04-10 DIAGNOSIS — N390 Urinary tract infection, site not specified: Secondary | ICD-10-CM | POA: Diagnosis not present

## 2022-04-10 DIAGNOSIS — Z8546 Personal history of malignant neoplasm of prostate: Secondary | ICD-10-CM

## 2022-04-10 NOTE — Progress Notes (Signed)
Suprapubic Cath Change  Patient is present today for a suprapubic catheter change due to urinary retention.  46ml of water was drained from the balloon, a 16FR foley cath was removed from the tract with out difficulty.  Site was cleaned and prepped in a sterile fashion with betadine.  A 16FR foley cath was replaced into the tract no complications were noted. Catheter was flushed with 70ml of sterile water with return.  66ml of sterile water was inflated into the balloon and a bed bag was attached for drainage.  Patient tolerated well.  Performed by: Levi Aland, CMA  Follow up: Follow up in 1 month for cath change.

## 2022-04-10 NOTE — Progress Notes (Signed)
Assessment: 1. Chronic suprapubic catheter ([place])   2. History of prostate cancer   3. Frequent UTI     Plan: SP tube change today with a 16 French Foley catheter. Return to office in 1 month for SP tube change.  Chief Complaint:  Chief Complaint  Patient presents with   SP tube change    History of Present Illness:  Luke Quinn is a 88 y.o. year old male who is seen for further evaluation of urinary retention and recurrent UTIs.   He is status post radical retropubic prostatectomy in 2002 and had been followed by the [facility] in [place] for prostate cancer.  PSA from 1/22 was <0.010.  He has a history of urinary incontinence, previously requiring incontinence pads on a regular basis.  Cystoscopy from January 2017 showed a frozen bladder neck without evidence of stricture.  More recently, he has had problems with incomplete bladder emptying and recurrent UTIs.  He has been seen by urology at the [facility] clinic.  Bladder scan from June 2022 showed 262 mL in the bladder. Renal ultrasound from May 2022 showed no hydronephrosis, right renal cyst, nondistended bladder.  He has been treated for recurrent UTIs.  A Foley catheter was placed in August 2022.  This has been changed monthly by home health care.  He has continued to have frequent UTIs requiring antibiotic therapy.  Prior to his visit in 2/23, he had completed a course of amoxicillin and Levaquin for a UTI.    He has late stage Parkinson's disease, history of lung cancer, and is on chronic anticoagulation for atrial fibrillation.  Urine culture results: 2/23 >100 K Pseudomonas -treated with Omnicef 3/23 >100 K Morganella, Pseudomonas -treated with Omnicef and Levaquin 4/23 >100K Pseudomonas 5/23 >100K Pseudomonas - treated with Gentamicin IM x 3 days 6/23 >100K Pseudomonas - treated with Gentamicin x 3 days  CT imaging from 11/22/2021 showed punctate nonobstructing bilateral nephrolithiasis, several small stones  in the bladder, no evidence of obstruction. Cystoscopy from 4/23 demonstrated bladder trabeculations, tiny bladder calculi, post radical prostatectomy. He underwent placement of a suprapubic catheter by interventional radiology on 12/25/2021.  This was subsequently upsized to a 16 [place] Foley on 01/10/2022. His SP tube was changed on 02/06/22.  He has seen Dr. Juleen China with ID.    He presents today for a suprapubic tube change.  His tube was been draining well.  No problems with urinary sediment or hematuria.  He has not required treatment for UTIs since his last visit.  Portions of the above documentation were copied from a prior visit for review purposes only.   Past Medical History:  Past Medical History:  Diagnosis Date   AAA (abdominal aortic aneurysm) ([place])    status post EVAR November 2016.   Atrial fibrillation [facility]) 10/2020   Feb 2022, started on Eliquis   CAD (coronary artery disease)    multiple cardiac stents placed years ago.   Cancer [facility])    prostate   COPD (chronic obstructive pulmonary disease) (HCC)    Depression    Hypertension    Lung cancer ([place])    right;  s/p SBRT treatment completed in November 2017   Macular degeneration, age related    Ocular hypertension    Parkinson's disease ([place])    Pulmonary embolism ([place])    Feb 2022, started on Eliquis   PVD (peripheral vascular disease) ([place])    Shingles     Past Surgical History:  Past Surgical History:  Procedure Laterality Date   cardiac stents     IR CATHETER TUBE CHANGE  01/10/2022   PROSTATE SURGERY      Allergies:  Allergies  Allergen Reactions   Gabapentin     Pt is unable to maintain his balance while taking this med    Atorvastatin     Ankle swelling    Family History:  Family History  Problem Relation Age of Onset   Diabetes Mother    Cancer Mother    Hypertension Mother    Diabetes Father     Social History:  Social History   Tobacco Use   Smoking status: Former    Types:  Cigarettes    Quit date: 06/21/1995    Years since quitting: 26.8   Smokeless tobacco: Never  Vaping Use   Vaping Use: Never used  Substance Use Topics   Alcohol use: No    Alcohol/week: 0.0 standard drinks of alcohol   Drug use: No    ROS: Constitutional:  Negative for fever, chills, weight loss CV: Negative for chest pain, previous MI, hypertension Respiratory:  Negative for shortness of breath, wheezing, sleep apnea, frequent cough GI:  Negative for nausea, vomiting, bloody stool, GERD  Physical exam: GENERAL APPEARANCE:  Chronically ill appearing male on stretcher HEENT:  Atraumatic, normocephalic, oropharynx clear NECK:  Supple without lymphadenopathy or thyromegaly ABDOMEN:  Soft, non-tender, no masses; SP tube site intact EXTREMITIES:  No clubbing, cyanosis, or edema NEUROLOGIC:  Alert and oriented x 3, CN II-XII grossly intact MENTAL STATUS:  appropriate BACK:  Non-tender to palpation, No CVAT SKIN:  Warm, dry, and intact   Results: None  Procedure:  Suprapubic catheter change  Suprapubic Tube Foley catheter change is accomplished under sterile conditions using a 16 Fr. catheter.  Ten ml of sterile water is left in the retention balloon.  There is the immediate return of 30 ml of urine.  Tolerated well without complications.  Foley irrigates quantitatively.  Foley catheter is left to gravity drainage. Sterile dressings applied.

## 2022-05-07 ENCOUNTER — Ambulatory Visit (INDEPENDENT_AMBULATORY_CARE_PROVIDER_SITE_OTHER): Payer: No Typology Code available for payment source | Admitting: Urology

## 2022-05-07 ENCOUNTER — Encounter: Payer: Self-pay | Admitting: Urology

## 2022-05-07 VITALS — BP 143/79 | HR 67 | Ht 75.0 in | Wt 130.0 lb

## 2022-05-07 DIAGNOSIS — Z9359 Other cystostomy status: Secondary | ICD-10-CM | POA: Diagnosis not present

## 2022-05-07 DIAGNOSIS — Z87440 Personal history of urinary (tract) infections: Secondary | ICD-10-CM | POA: Diagnosis not present

## 2022-05-07 DIAGNOSIS — R339 Retention of urine, unspecified: Secondary | ICD-10-CM | POA: Diagnosis not present

## 2022-05-07 DIAGNOSIS — Z8546 Personal history of malignant neoplasm of prostate: Secondary | ICD-10-CM

## 2022-05-07 DIAGNOSIS — N390 Urinary tract infection, site not specified: Secondary | ICD-10-CM

## 2022-05-07 NOTE — Progress Notes (Signed)
Assessment: 1. Chronic suprapubic catheter ([place])   2. History of prostate cancer   3. Frequent UTI     Plan: SP tube change today with a 16 French Foley catheter. Return to office in 1 month for SP tube change.  Chief Complaint:  Chief Complaint  Patient presents with   SP tube change    History of Present Illness:  Luke Quinn is a 88 y.o. year old male who is seen for further evaluation of urinary retention and recurrent UTIs.   He is status post radical retropubic prostatectomy in 2002 and had been followed by the [facility] in [place] for prostate cancer.  PSA from 1/22 was <0.010.  He has a history of urinary incontinence, previously requiring incontinence pads on a regular basis.  Cystoscopy from January 2017 showed a frozen bladder neck without evidence of stricture.  More recently, he has had problems with incomplete bladder emptying and recurrent UTIs.  He has been seen by urology at the [facility] clinic.  Bladder scan from June 2022 showed 262 mL in the bladder. Renal ultrasound from May 2022 showed no hydronephrosis, right renal cyst, nondistended bladder.  He has been treated for recurrent UTIs.  A Foley catheter was placed in August 2022.  This has been changed monthly by home health care.  He has continued to have frequent UTIs requiring antibiotic therapy.  Prior to his visit in 2/23, he had completed a course of amoxicillin and Levaquin for a UTI.    He has late stage Parkinson's disease, history of lung cancer, and is on chronic anticoagulation for atrial fibrillation.  Urine culture results: 2/23 >100 K Pseudomonas -treated with Omnicef 3/23 >100 K Morganella, Pseudomonas -treated with Omnicef and Levaquin 4/23 >100K Pseudomonas 5/23 >100K Pseudomonas - treated with Gentamicin IM x 3 days 6/23 >100K Pseudomonas - treated with Gentamicin x 3 days  CT imaging from 11/22/2021 showed punctate nonobstructing bilateral nephrolithiasis, several small stones  in the bladder, no evidence of obstruction. Cystoscopy from 4/23 demonstrated bladder trabeculations, tiny bladder calculi, post radical prostatectomy. He underwent placement of a suprapubic catheter by interventional radiology on 12/25/2021.  This was subsequently upsized to a 16 [place] Foley on 01/10/2022. He has seen Dr. Juleen China with ID.   His SP tube was last changed on 04/10/22.  He presents today for a suprapubic tube change.  His tube was been draining well.  No problems with urinary sediment or hematuria.  He has not required treatment for UTIs since his last visit.  Portions of the above documentation were copied from a prior visit for review purposes only.   Past Medical History:  Past Medical History:  Diagnosis Date   AAA (abdominal aortic aneurysm) ([place])    status post EVAR November 2016.   Atrial fibrillation [facility]) 10/2020   Feb 2022, started on Eliquis   CAD (coronary artery disease)    multiple cardiac stents placed years ago.   Cancer [facility])    prostate   COPD (chronic obstructive pulmonary disease) (HCC)    Depression    Hypertension    Lung cancer ([place])    right;  s/p SBRT treatment completed in November 2017   Macular degeneration, age related    Ocular hypertension    Parkinson's disease ([place])    Pulmonary embolism ([place])    Feb 2022, started on Eliquis   PVD (peripheral vascular disease) ([place])    Shingles     Past Surgical History:  Past Surgical History:  Procedure Laterality Date   cardiac stents     IR CATHETER TUBE CHANGE  01/10/2022   PROSTATE SURGERY      Allergies:  Allergies  Allergen Reactions   Gabapentin     Pt is unable to maintain his balance while taking this med    Atorvastatin     Ankle swelling    Family History:  Family History  Problem Relation Age of Onset   Diabetes Mother    Cancer Mother    Hypertension Mother    Diabetes Father     Social History:  Social History   Tobacco Use   Smoking status: Former     Types: Cigarettes    Quit date: 06/21/1995    Years since quitting: 26.8   Smokeless tobacco: Never  Vaping Use   Vaping Use: Never used  Substance Use Topics   Alcohol use: No    Alcohol/week: 0.0 standard drinks of alcohol   Drug use: No    ROS: Constitutional:  Negative for fever, chills, weight loss CV: Negative for chest pain, previous MI, hypertension Respiratory:  Negative for shortness of breath, wheezing, sleep apnea, frequent cough GI:  Negative for nausea, vomiting, bloody stool, GERD  Physical exam: BP (!) 143/79   Pulse 67   Ht 6\' 3"  (1.905 m)   Wt 130 lb (59 kg)   BMI 16.25 kg/m  GENERAL APPEARANCE:  Chronically ill appearing male on stretcher, NAD HEENT:  Atraumatic, normocephalic, oropharynx clear ABDOMEN:  Soft, non-tender, no masses; SP tube site intact EXTREMITIES:  Without clubbing, cyanosis, or edema NEUROLOGIC:  Alert and oriented x 3, CN II-XII grossly intact MENTAL STATUS:  appropriate BACK:  Non-tender to palpation, No CVAT SKIN:  Warm, dry, and intact   Results: None

## 2022-05-07 NOTE — Progress Notes (Signed)
Suprapubic Cath Change  Patient is present today for a suprapubic catheter change due to urinary retention.  57ml of water was drained from the balloon, a 16FR foley cath was removed from the tract with out difficulty.  Site was cleaned and prepped in a sterile fashion with betadine.  A 16FR foley cath was replaced into the tract no complications were noted. Urine return was noted, 10 ml of sterile water was inflated into the balloon and a bed bag was attached for drainage.  Patient tolerated well. A night bag was given to patient and proper instruction was given on how to switch bags.    Performed by: Levi Aland, CMA  Follow up: Follow up with MD.

## 2022-06-05 ENCOUNTER — Ambulatory Visit (INDEPENDENT_AMBULATORY_CARE_PROVIDER_SITE_OTHER): Payer: No Typology Code available for payment source | Admitting: Physician Assistant

## 2022-06-05 DIAGNOSIS — Z9359 Other cystostomy status: Secondary | ICD-10-CM

## 2022-06-05 DIAGNOSIS — R339 Retention of urine, unspecified: Secondary | ICD-10-CM

## 2022-06-05 NOTE — Progress Notes (Signed)
Suprapubic Cath Change  Patient is present today for a suprapubic catheter change due to urinary retention.  57ml of water was drained from the balloon, a 16FR foley cath was removed from the tract with out difficulty.  Site was cleaned and prepped in a sterile fashion with betadine.  A 16FR foley cath was replaced into the tract no complications were noted. Urine return was noted, 10 ml of sterile water was inflated into the balloon and a bedside bag was attached for drainage.  Patient tolerated well.   Performed by: Estill Bamberg RN  Follow up: 1 month NV SP tube change

## 2022-07-03 ENCOUNTER — Ambulatory Visit: Payer: No Typology Code available for payment source | Admitting: Physician Assistant

## 2022-07-07 ENCOUNTER — Ambulatory Visit (INDEPENDENT_AMBULATORY_CARE_PROVIDER_SITE_OTHER): Payer: No Typology Code available for payment source | Admitting: Physician Assistant

## 2022-07-07 VITALS — BP 146/68 | HR 81 | Ht 75.0 in | Wt 130.0 lb

## 2022-07-07 DIAGNOSIS — R339 Retention of urine, unspecified: Secondary | ICD-10-CM

## 2022-07-07 DIAGNOSIS — R1030 Lower abdominal pain, unspecified: Secondary | ICD-10-CM

## 2022-07-07 DIAGNOSIS — Z9359 Other cystostomy status: Secondary | ICD-10-CM | POA: Diagnosis not present

## 2022-07-07 DIAGNOSIS — N390 Urinary tract infection, site not specified: Secondary | ICD-10-CM

## 2022-07-07 DIAGNOSIS — Z87442 Personal history of urinary calculi: Secondary | ICD-10-CM | POA: Diagnosis not present

## 2022-07-07 MED ORDER — CEFDINIR 300 MG PO CAPS: 300.0000 mg | ORAL_CAPSULE | Freq: Two times a day (BID) | ORAL | 0 refills | Status: AC

## 2022-07-07 NOTE — Progress Notes (Signed)
Assessment: 1. Chronic suprapubic catheter ([place]) - PR CHANGE OF BLADDER TUBE,SIMPLE  2. Frequent UTI  3. Lower abdominal pain    Plan: Will initiate tx for current sxs with Omnicef.  Will adjust if indicated by culture results.  Urine collected directly from the catheter.  During the SP tube change, neither nursing nor this provider are able to withdraw the catheter without significant resistance.  Dr. Alyson Ingles contacted for evaluation and SP tube removed by him. Replaced by clinical staff as noted. FU in 1 month for next change. ED precautions discussed.  Meds ordered this encounter  Medications   cefdinir (OMNICEF) 300 MG capsule    Sig: Take 1 capsule (300 mg total) by mouth 2 (two) times daily.    Dispense:  14 capsule    Refill:  0     Chief Complaint: No chief complaint on file.   HPI: Luke Quinn is a 88 y.o. male who presents for continued evaluation of chronic indwelling SP tube change.  He presents with his daughter states urine has had an increase in sediment with change in odor for a couple of weeks.  The patient reports 2 to 3-day history of lower abdominal pain.  No fever, chills, nausea or vomiting.  Patient's daughter confirms he has not had mental status changes. Home health is no longer in the home. He arrived via gurney/EMS  05/07/22 Luke Quinn is a 88 y.o. year old male who is seen for further evaluation of urinary retention and recurrent UTIs.   He is status post radical retropubic prostatectomy in 2002 and had been followed by the [facility] in [place] for prostate cancer.  PSA from 1/22 was <0.010.  He has a history of urinary incontinence, previously requiring incontinence pads on a regular basis.  Cystoscopy from January 2017 showed a frozen bladder neck without evidence of stricture.  More recently, he has had problems with incomplete bladder emptying and recurrent UTIs.  He has been seen by urology at the [facility] clinic.  Bladder scan  from June 2022 showed 262 mL in the bladder. Renal ultrasound from May 2022 showed no hydronephrosis, right renal cyst, nondistended bladder.  He has been treated for recurrent UTIs.  A Foley catheter was placed in August 2022.  This has been changed monthly by home health care.  He has continued to have frequent UTIs requiring antibiotic therapy.  Prior to his visit in 2/23, he had completed a course of amoxicillin and Levaquin for a UTI.     He has late stage Parkinson's disease, history of lung cancer, and is on chronic anticoagulation for atrial fibrillation.   Urine culture results: 2/23     >100 K Pseudomonas -treated with Omnicef 3/23     >100 K Morganella, Pseudomonas -treated with Omnicef and Levaquin 4/23     >100K Pseudomonas 5/23     >100K Pseudomonas - treated with Gentamicin IM x 3 days 6/23     >100K Pseudomonas - treated with Gentamicin x 3 days   CT imaging from 11/22/2021 showed punctate nonobstructing bilateral nephrolithiasis, several small stones in the bladder, no evidence of obstruction. Cystoscopy from 4/23 demonstrated bladder trabeculations, tiny bladder calculi, post radical prostatectomy. He underwent placement of a suprapubic catheter by interventional radiology on 12/25/2021.  This was subsequently upsized to a 16 [place] Foley on 01/10/2022. He has seen Dr. Juleen China with ID.   His SP tube was last changed on 04/10/22.   He presents today for  a suprapubic tube change.  His tube was been draining well.  No problems with urinary sediment or hematuria.  He has not required treatment for UTIs since his last visit. Portions of the above documentation were copied from a prior visit for review purposes only.  Allergies: Allergies  Allergen Reactions   Gabapentin     Pt is unable to maintain his balance while taking this med    Atorvastatin     Ankle swelling    PMH: Past Medical History:  Diagnosis Date   AAA (abdominal aortic aneurysm) ([place])    status post EVAR  November 2016.   Atrial fibrillation [facility]) 10/2020   Feb 2022, started on Eliquis   CAD (coronary artery disease)    multiple cardiac stents placed years ago.   Cancer [facility])    prostate   COPD (chronic obstructive pulmonary disease) (HCC)    Depression    Hypertension    Lung cancer ([place])    right;  s/p SBRT treatment completed in November 2017   Macular degeneration, age related    Ocular hypertension    Parkinson's disease ([place])    Pulmonary embolism ([place])    Feb 2022, started on Eliquis   PVD (peripheral vascular disease) ([place])    Shingles     PSH: Past Surgical History:  Procedure Laterality Date   cardiac stents     IR CATHETER TUBE CHANGE  01/10/2022   PROSTATE SURGERY      SH: Social History   Tobacco Use   Smoking status: Former    Types: Cigarettes    Quit date: 06/21/1995    Years since quitting: 27.0   Smokeless tobacco: Never  Vaping Use   Vaping Use: Never used  Substance Use Topics   Alcohol use: No    Alcohol/week: 0.0 standard drinks of alcohol   Drug use: No    ROS: All other review of systems were reviewed and are negative except what is noted above in HPI  PE: BP (!) 146/68   Pulse 81   Ht 6\' 3"  (1.905 m)   Wt 130 lb (59 kg)   BMI 16.25 kg/m  GENERAL APPEARANCE:  Well appearing, well developed, well nourished, NAD HEENT:  Atraumatic, normocephalic NECK:  Supple. Trachea midline ABDOMEN:  Soft, mild suprapubic tenderness around SP site. No erythema/induration, no masses NEUROLOGIC:  Alert and oriented  BACK:  Non-tender to palpation, No CVAT SKIN:  Warm, dry, and intact   Results: Laboratory Data: Lab Results  Component Value Date   WBC 8.1 03/27/2022   HGB 9.1 (L) 03/27/2022   HCT 29.9 (L) 03/27/2022   MCV 93.7 03/27/2022   PLT 182 03/27/2022    Lab Results  Component Value Date   CREATININE 0.55 (L) 03/27/2022    No results found for: "PSA"  No results found for: "TESTOSTERONE"  No results found for:  "HGBA1C"  Urinalysis    Component Value Date/Time   COLORURINE AMBER (A) 03/25/2022 1813   APPEARANCEUR CLOUDY (A) 03/25/2022 1813   APPEARANCEUR Cloudy (A) 01/20/2022 1535   LABSPEC 1.023 03/25/2022 1813   PHURINE 5.0 03/25/2022 1813   GLUCOSEU NEGATIVE 03/25/2022 1813   HGBUR LARGE (A) 03/25/2022 1813   BILIRUBINUR NEGATIVE 03/25/2022 1813   BILIRUBINUR Negative 01/20/2022 1535   KETONESUR NEGATIVE 03/25/2022 1813   PROTEINUR >=300 (A) 03/25/2022 1813   NITRITE NEGATIVE 03/25/2022 1813   LEUKOCYTESUR MODERATE (A) 03/25/2022 1813    Lab Results  Component Value Date   LABMICR Comment  01/20/2022   WBCUA 11-30 (A) 12/04/2021   LABEPIT 0-10 12/04/2021   MUCUS Present 12/04/2021   BACTERIA RARE (A) 03/25/2022    Pertinent Imaging: Results for orders placed during the hospital encounter of 03/25/22  DG Abd 1 View  Narrative CLINICAL DATA:  Abdominal pain.  EXAM: ABDOMEN - 1 VIEW  COMPARISON:  CT without contrast 11/22/2021.  FINDINGS: The bowel gas pattern is nonobstructive. No radio-opaque calculi or other significant radiographic abnormality are seen.  There were multiple bilateral punctate nonobstructive caliceal stones in both kidneys on the CT but they are not visible radiographically.  The aorta and common iliac arteries are heavily calcified. An aortic stent graft repair is again noted.  The excluded aneurysm sac is faintly visible to the left of the graft indicated by a thin curvilinear calcification at the level L3-4.  The rectum is no longer dilated with stool. There is moderate retained stool in the ascending colon but no dilated segments of small or large bowel.  Numerous surgical clips are noted in the pelvic sidewalls and floor from prior radical prostatectomy. Visceral shadows are stable.  Levoscoliosis and advanced degenerative change again noted lumbar spine with osteopenia. Chronic change left lung base.  IMPRESSION: 1. Constipation  without evidence of small-bowel obstruction. There was stool dilatation of the rectum on the prior study which is not seen today. 2. Punctate nonobstructive caliceal stones were noted in both kidneys on CT but are not visible radiographically. No new pathologic calcification. 3. Aortic atherosclerosis with AAA stent graft repair.   Electronically Signed By: Telford Nab M.D. On: 03/26/2022 20:59  No results found for this or any previous visit.  No results found for this or any previous visit.  No results found for this or any previous visit.  No results found for this or any previous visit.  No valid procedures specified. No results found for this or any previous visit.  Results for orders placed during the hospital encounter of 11/22/21  CT RENAL STONE STUDY  Narrative CLINICAL DATA:  Frequent urinary tract infections. Urinary retention. History of prostate and lung cancer.  EXAM: CT ABDOMEN AND PELVIS WITHOUT CONTRAST  TECHNIQUE: Multidetector CT imaging of the abdomen and pelvis was performed following the standard protocol without IV contrast.  RADIATION DOSE REDUCTION: This exam was performed according to the departmental dose-optimization program which includes automated exposure control, adjustment of the mA and/or kV according to patient size and/or use of iterative reconstruction technique.  COMPARISON:  None.  FINDINGS: Lower chest: Subpleural medial right middle lobe 1.5 cm nodular opacity (series 4/image 24), stable since 10/23/2020 chest CT angiogram. Similar 2.2 cm nodular opacity in the posterior right lower lobe (series 4/image 15), stable. Patchy subpleural reticulation in both lung bases, unchanged. Mild cardiomegaly. Coronary atherosclerosis.  Hepatobiliary: Normal liver size. No liver mass. Normal gallbladder with no radiopaque cholelithiasis. No biliary ductal dilatation.  Pancreas: Normal, with no mass or duct dilation.  Spleen:  Normal size. No mass.  Adrenals/Urinary Tract: Left adrenal 1.5 cm nodule with density 7 HU, stable, compatible with an adenoma. No discrete right adrenal nodules. A few punctate nonobstructing 1 mm stones scattered in the upper and lower right kidney and upper left kidney. No hydronephrosis. A few scattered simple bilateral renal cortical cysts, largest 2.3 cm in the medial upper right kidney. Homogeneous hyperdense subcentimeter medial upper left renal cortical lesion is too small to characterize. Normal caliber ureters. No ureteral stones. Bladder nearly collapsed by indwelling Foley catheter. Expected gas  in the nondependent bladder lumen from instrumentation. Several small layering stones in the bladder, largest 6 mm. No definite bladder wall thickening.  Stomach/Bowel: Normal non-distended stomach. Normal caliber small bowel with no small bowel wall thickening. Normal appendix. Moderate left colonic diverticulosis with no large bowel wall thickening or significant pericolonic fat stranding. Marked rectal distention by stool (9.9 cm rectal diameter) with no definite rectal wall thickening or significant perirectal fat stranding.  Vascular/Lymphatic: Atherosclerotic abdominal aorta with 5.7 cm infrarenal abdominal aortic aneurysm status post stent graft repair. No pathologically enlarged lymph nodes in the abdomen or pelvis.  Reproductive: Post prostatectomy changes.  Other: No pneumoperitoneum, ascites or focal fluid collection.  Musculoskeletal: No aggressive appearing focal osseous lesions. Moderate thoracolumbar spondylosis.  IMPRESSION: 1. Marked rectal distention by stool, suggesting fecal impaction. No evidence of bowel obstruction or acute bowel inflammation. Moderate left colonic diverticulosis. 2. Punctate nonobstructing bilateral nephrolithiasis. Several small layering stones in the bladder. No hydronephrosis. No ureteral stones. 3. Subpleural nodular opacities  at the right lung base, largest 2.2 cm in the posterior right lower lobe, not definitely changed since 10/23/2020 chest CT angiogram study. Recommend attention on follow-up noncontrast chest CT in 3-6 months. 4. Infrarenal 5.7 cm abdominal aortic aneurysm status post stent graft repair. 5. Mild cardiomegaly. Coronary atherosclerosis. 6. Stable left adrenal adenoma. 7. Aortic Atherosclerosis (ICD10-I70.0).   Electronically Signed By: Ilona Sorrel M.D. On: 11/22/2021 15:32  No results found for this or any previous visit (from the past 24 hour(s)).

## 2022-07-07 NOTE — Progress Notes (Signed)
Suprapubic Cath Change  Patient is present today for a suprapubic catheter change due to urinary retention.  66ml of water was drained from the balloon, a 16FR foley cath was removed from the tract with out difficulty.  Site was cleaned and prepped in a sterile fashion with betadine.  A 16FR foley cath was replaced into the tract complications were noted as: resistance when removing catheter, MD assisted sp change . Urine return was noted, 10 ml of sterile water was inflated into the balloon and a bed bag was attached for drainage.  Patient tolerated well.   Performed by: Levi Aland, CMA  Follow up: Follow up as scheduled, urine sent for ua.

## 2022-07-09 LAB — URINE CULTURE

## 2022-07-23 ENCOUNTER — Telehealth: Payer: Self-pay

## 2022-07-23 NOTE — Telephone Encounter (Signed)
Patient's daughter called in concern about her dad's SP tube rerouting around dad and urine is coming out of the bag onto the bedpad . Offer appt for SP tube check. Daughter voiced understanding and patient will come in tomorrow for SP tube check.

## 2022-07-24 ENCOUNTER — Ambulatory Visit (INDEPENDENT_AMBULATORY_CARE_PROVIDER_SITE_OTHER): Payer: No Typology Code available for payment source | Admitting: Urology

## 2022-07-24 ENCOUNTER — Ambulatory Visit: Payer: No Typology Code available for payment source | Admitting: Physician Assistant

## 2022-07-24 DIAGNOSIS — R82998 Other abnormal findings in urine: Secondary | ICD-10-CM

## 2022-07-24 DIAGNOSIS — N390 Urinary tract infection, site not specified: Secondary | ICD-10-CM

## 2022-07-24 NOTE — Progress Notes (Signed)
Patient's wife states the catheter was not draining.  Pt's daughter flushed the catheter on 07/23/2022 and since then the catheter has been draining.  Pt's wife states the urine is dark with a slight odor and pt has a lot of sediment draining.  Verbal from Dr. Alyson Ingles to get a urine sample and sent for culture.  Urine sample obtained from catheter tube and new bed bag attached.  Urine sent for culture and patient will follow up as scheduled.

## 2022-07-26 LAB — URINE CULTURE

## 2022-07-28 ENCOUNTER — Telehealth: Payer: Self-pay

## 2022-07-28 NOTE — Telephone Encounter (Signed)
Patient's wife voiced understanding and states patient is doing much better.

## 2022-07-28 NOTE — Telephone Encounter (Signed)
-----   Message from Primus Bravo, MD sent at 07/28/2022  1:18 PM EST ----- Is the patient symptomatic for a UTI?  ----- Message ----- From: Audie Box, CMA Sent: 07/28/2022   1:12 PM EST To: Primus Bravo, MD  Please review.

## 2022-07-28 NOTE — Telephone Encounter (Signed)
No, patient came in Thursday because his catheter was not draining and wife noticed increased sediment in bag.  Daughter flushed catheter at home and the catheter was draining fine at visit.  The wife wanted to be there was no infection.

## 2022-08-06 ENCOUNTER — Ambulatory Visit (INDEPENDENT_AMBULATORY_CARE_PROVIDER_SITE_OTHER): Payer: No Typology Code available for payment source | Admitting: Physician Assistant

## 2022-08-06 DIAGNOSIS — R339 Retention of urine, unspecified: Secondary | ICD-10-CM

## 2022-08-06 DIAGNOSIS — Z978 Presence of other specified devices: Secondary | ICD-10-CM

## 2022-08-06 NOTE — Progress Notes (Signed)
Suprapubic Cath Change  Patient is present today for a suprapubic catheter change due to urinary retention.  57ml of water was drained from the balloon, a 16FR foley cath was removed from the tract with out difficulty.  Site was cleaned and prepped in a sterile fashion with betadine.  A 16FR foley cath was replaced into the tract no complications were noted. Urine return was noted, 10 ml of sterile water was inflated into the balloon and a bed bag was attached for drainage.  Patient tolerated well. A night bag was given to patient and proper instruction was given on how to switch bags.    Performed by: Levi Aland, CMA  Follow up: Follow up as scheduled.    Procedure reviewed. Agree with clinical documentation.  Julienne A Summerlin PA-C

## 2022-09-04 ENCOUNTER — Ambulatory Visit (INDEPENDENT_AMBULATORY_CARE_PROVIDER_SITE_OTHER): Payer: No Typology Code available for payment source | Admitting: Urology

## 2022-09-04 DIAGNOSIS — R339 Retention of urine, unspecified: Secondary | ICD-10-CM | POA: Diagnosis not present

## 2022-09-04 DIAGNOSIS — Z978 Presence of other specified devices: Secondary | ICD-10-CM

## 2022-09-11 NOTE — Progress Notes (Signed)
Suprapubic Cath Change  Patient is present today for a suprapubic catheter change due to urinary retention.  69ml of water was drained from the balloon, a 16FR foley cath was removed from the tract with out difficulty.  Site was cleaned and prepped in a sterile fashion with betadine.  A 16FR foley cath was replaced into the tract no complications were noted. Urine return was noted, 10 ml of sterile water was inflated into the balloon and a bedside bag was attached for drainage.  Patient tolerated well.     Performed by: Ione Sandusky LPN  Follow up: 1 month nv sp change

## 2022-09-30 ENCOUNTER — Ambulatory Visit: Payer: No Typology Code available for payment source

## 2022-10-16 DEATH — deceased

## 2023-05-23 IMAGING — CT CT RENAL STONE PROTOCOL
2 of 4 series · 14 of 46 positions shown, 16 images · non-contrast
Comparison: None.

CLINICAL DATA: Frequent urinary tract infections. Urinary
retention. History of prostate and lung cancer.



[Series 2: axial st · axial · 0.80mm/px · z∈[+905,+1330]mm · 11 of 97 slices shown, 13 images]
[im 6/97  soft-tissue]
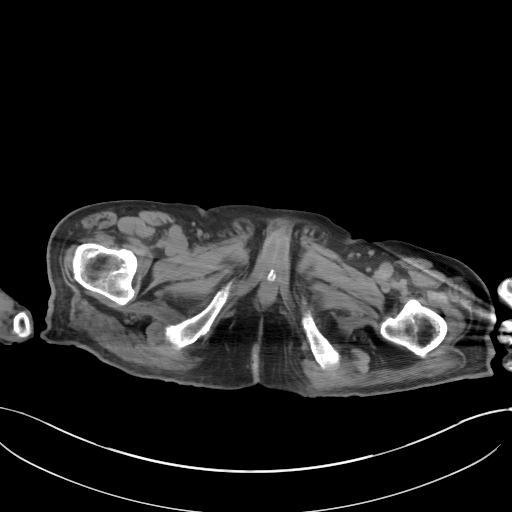
[im 6/97  bone]
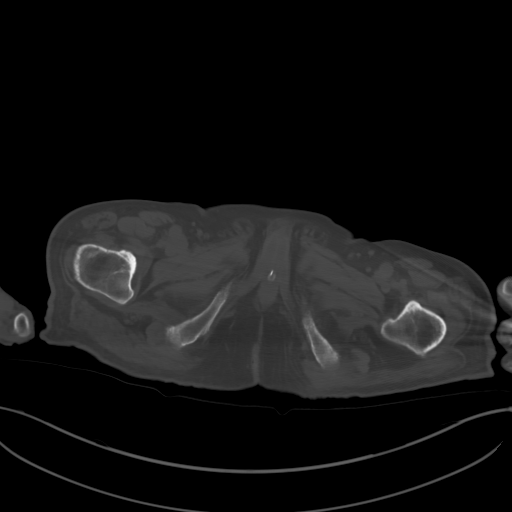
[im 16/97  soft-tissue]
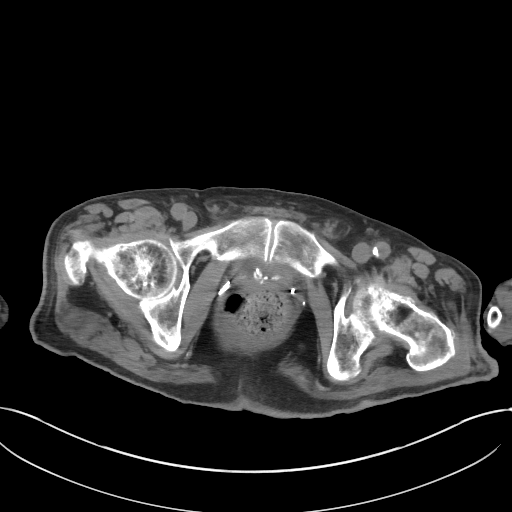
[im 26/97  soft-tissue]
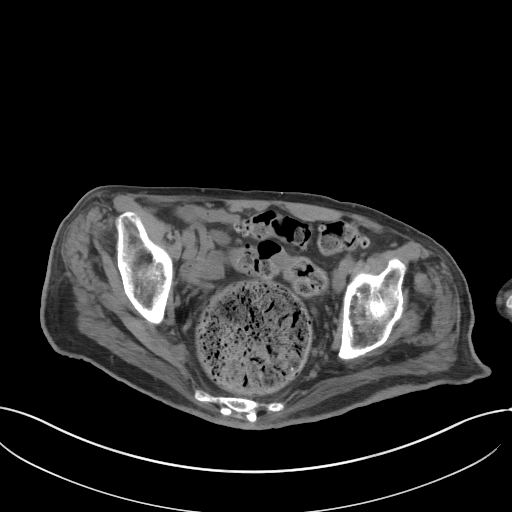
[im 31/97  soft-tissue]
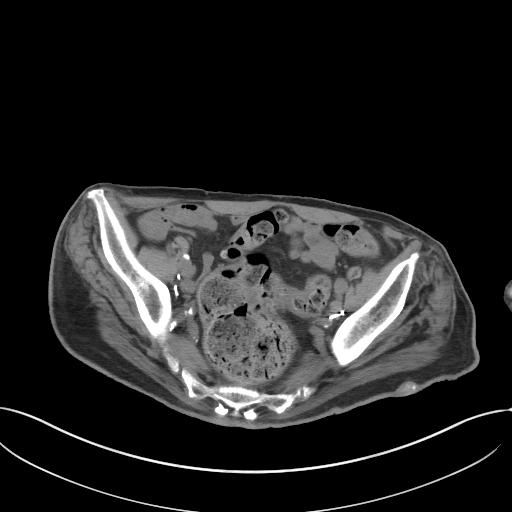
[im 41/97  soft-tissue]
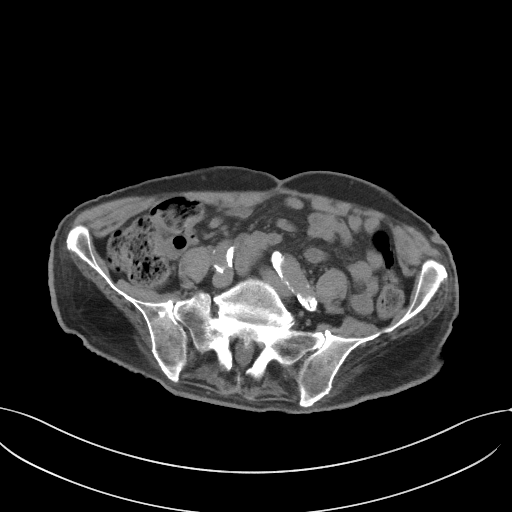
[im 51/97  soft-tissue]
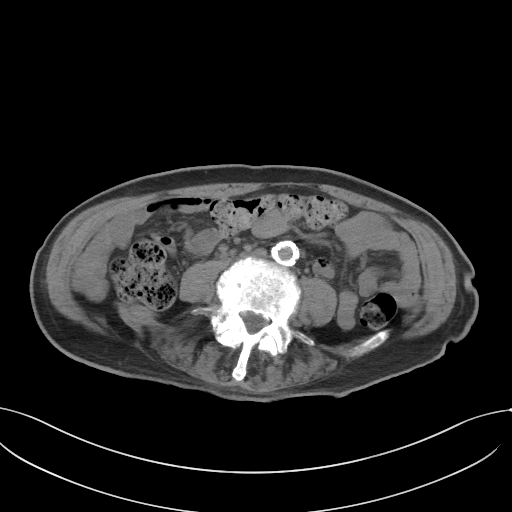
[im 56/97  soft-tissue]
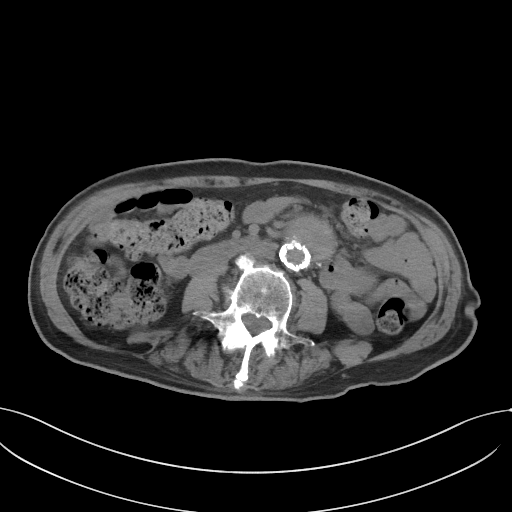
[im 66/97  soft-tissue]
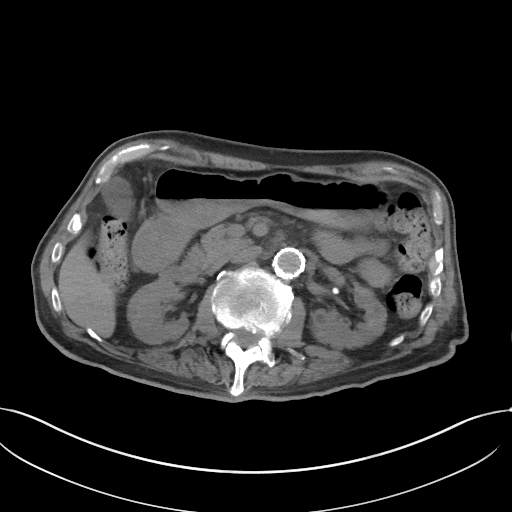
[im 71/97  soft-tissue]
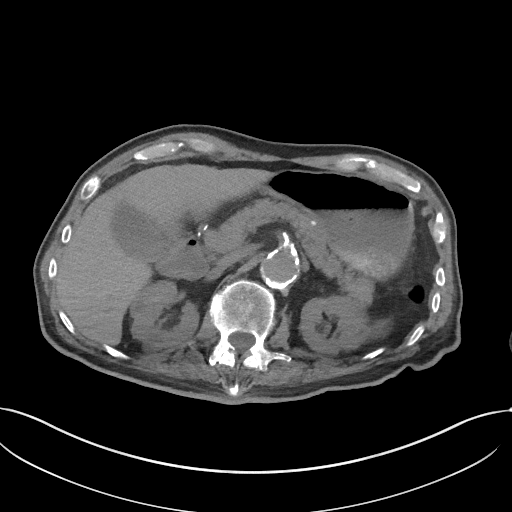
[im 71/97  bone]
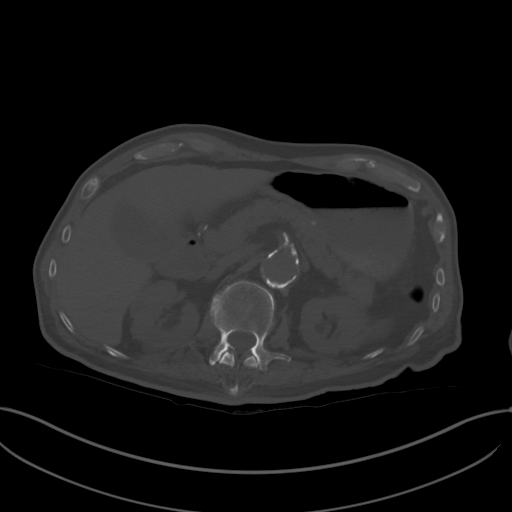
[im 81/97  soft-tissue]
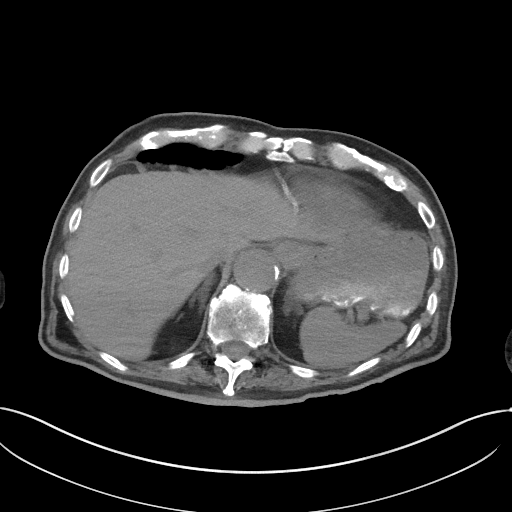
[im 91/97  soft-tissue]
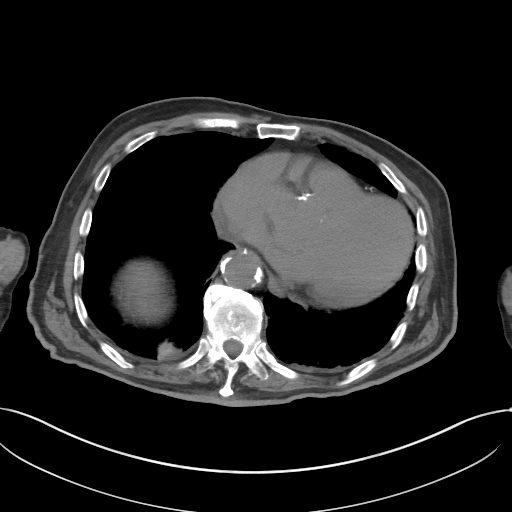

[Series 5: coronal st · coronal · 0.75mm/px · 3 of 95 slices shown]
[im 32/95  soft-tissue]
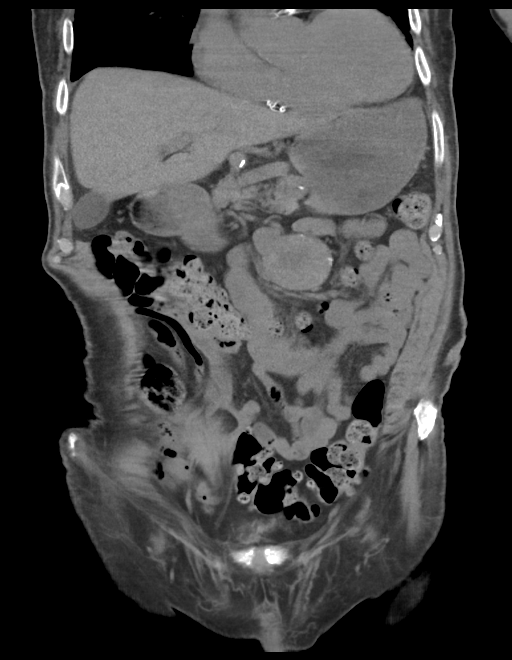
[im 42/95  soft-tissue]
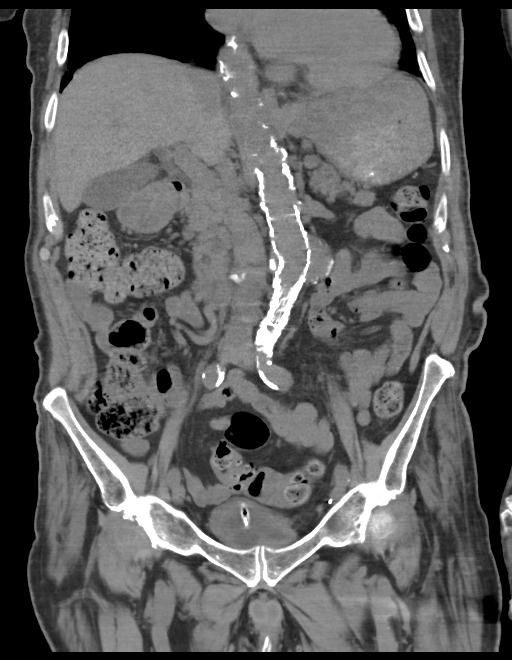
[im 53/95  soft-tissue]
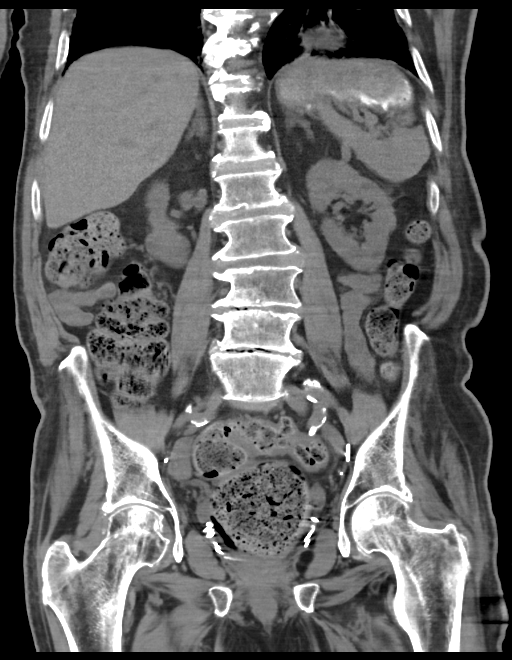

[14 of 46 positions shown; findings below may reference images not displayed]

FINDINGS: Lower chest: Subpleural medial right middle lobe 1.5 cm nodular
opacity (series 4/image 24), stable since 10/23/2020 chest CT
angiogram. Similar 2.2 cm nodular opacity in the posterior right
lower lobe (series 4/image 15), stable. Patchy subpleural
reticulation in both lung bases, unchanged. Mild cardiomegaly.
Coronary atherosclerosis.

Hepatobiliary: Normal liver size. No liver mass. Normal gallbladder
with no radiopaque cholelithiasis. No biliary ductal dilatation.

Pancreas: Normal, with no mass or duct dilation.

Spleen: Normal size. No mass.

Adrenals/Urinary Tract: Left adrenal 1.5 cm nodule with density 7
HU, stable, compatible with an adenoma. No discrete right adrenal
nodules. A few punctate nonobstructing 1 mm stones scattered in the
upper and lower right kidney and upper left kidney. No
hydronephrosis. A few scattered simple bilateral renal cortical
cysts, largest 2.3 cm in the medial upper right kidney. Homogeneous
hyperdense subcentimeter medial upper left renal cortical lesion is
too small to characterize. Normal caliber ureters. No ureteral
stones. Bladder nearly collapsed by indwelling Foley catheter.
Expected gas in the nondependent bladder lumen from instrumentation.
Several small layering stones in the bladder, largest 6 mm. No
definite bladder wall thickening.

Stomach/Bowel: Normal non-distended stomach. Normal caliber small
bowel with no small bowel wall thickening. Normal appendix. Moderate
left colonic diverticulosis with no large bowel wall thickening or
significant pericolonic fat stranding. Marked rectal distention by
stool (9.9 cm rectal diameter) with no definite rectal wall
thickening or significant perirectal fat stranding.

Vascular/Lymphatic: Atherosclerotic abdominal aorta with 5.7 cm
infrarenal abdominal aortic aneurysm status post stent graft repair.
No pathologically enlarged lymph nodes in the abdomen or pelvis.

Reproductive: Post prostatectomy changes.

Other: No pneumoperitoneum, ascites or focal fluid collection.

Musculoskeletal: No aggressive appearing focal osseous lesions.
Moderate thoracolumbar spondylosis.
IMPRESSION: 1. Marked rectal distention by stool, suggesting fecal impaction. No
evidence of bowel obstruction or acute bowel inflammation. Moderate
left colonic diverticulosis.
2. Punctate nonobstructing bilateral nephrolithiasis. Several small
layering stones in the bladder. No hydronephrosis. No ureteral
stones.
3. Subpleural nodular opacities at the right lung base, largest
cm in the posterior right lower lobe, not definitely changed since
10/23/2020 chest CT angiogram study. Recommend attention on
follow-up noncontrast chest CT in 3-6 months.
4. Infrarenal 5.7 cm abdominal aortic aneurysm status post stent
graft repair.
5. Mild cardiomegaly. Coronary atherosclerosis.
6. Stable left adrenal adenoma.
7. Aortic Atherosclerosis ([id number]).

## 2023-07-11 IMAGING — XA IR CATHETER TUBE CHANGE
2 series · 3 of 3 positions shown · non-contrast
Comparison: None.

INDICATION: 87-year-old male with chronic urinary retention and a recently
placed percutaneous suprapubic catheter. Currently the suprapubic
tube is a 14 French all-purpose drain. Patient presents for catheter
up size and exchange.

EXAM:
IR CATHETER TUBE CHANGE

[Series 1: fl (-) angio · 1 of 1 slices shown (1 of 2)]
[im 1/1]
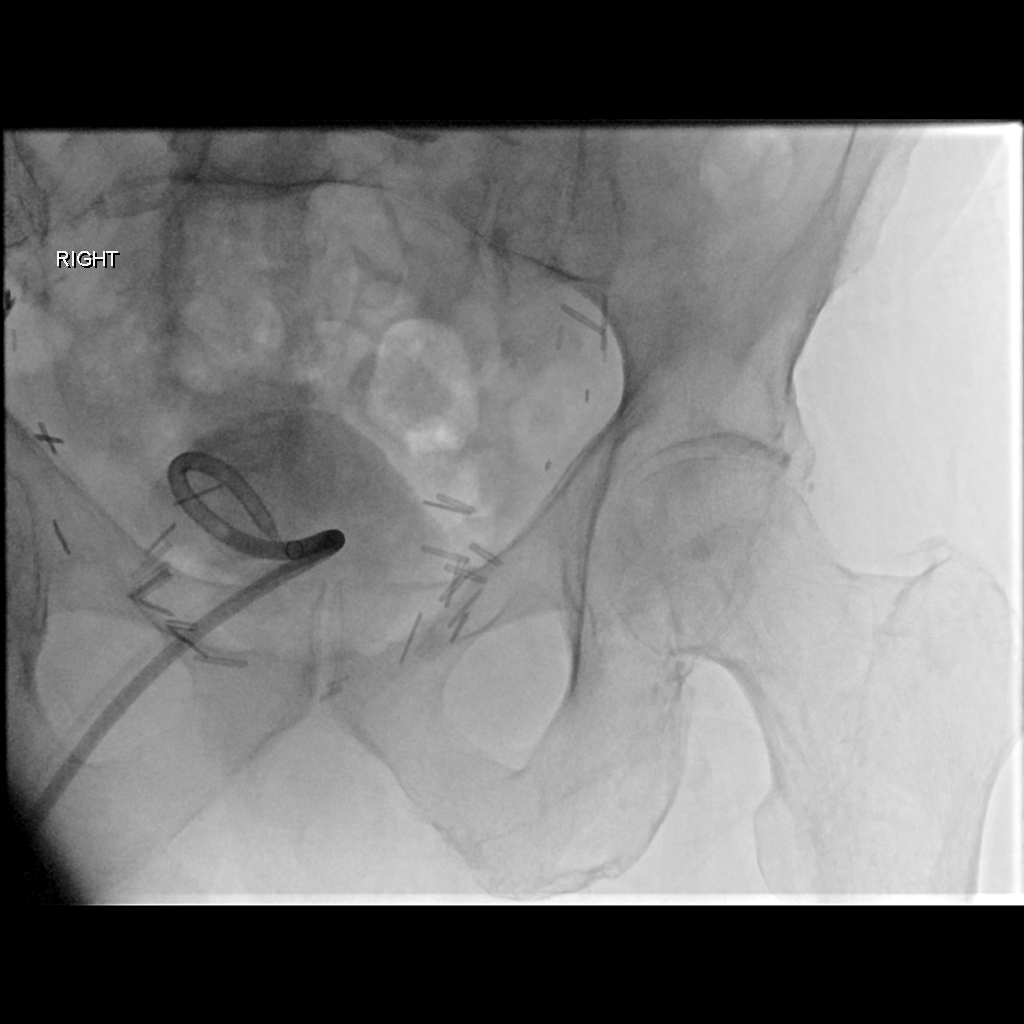

[Series 2: fl (-) angio · 2 of 2 slices shown (2 of 2)]
[im 1/2]
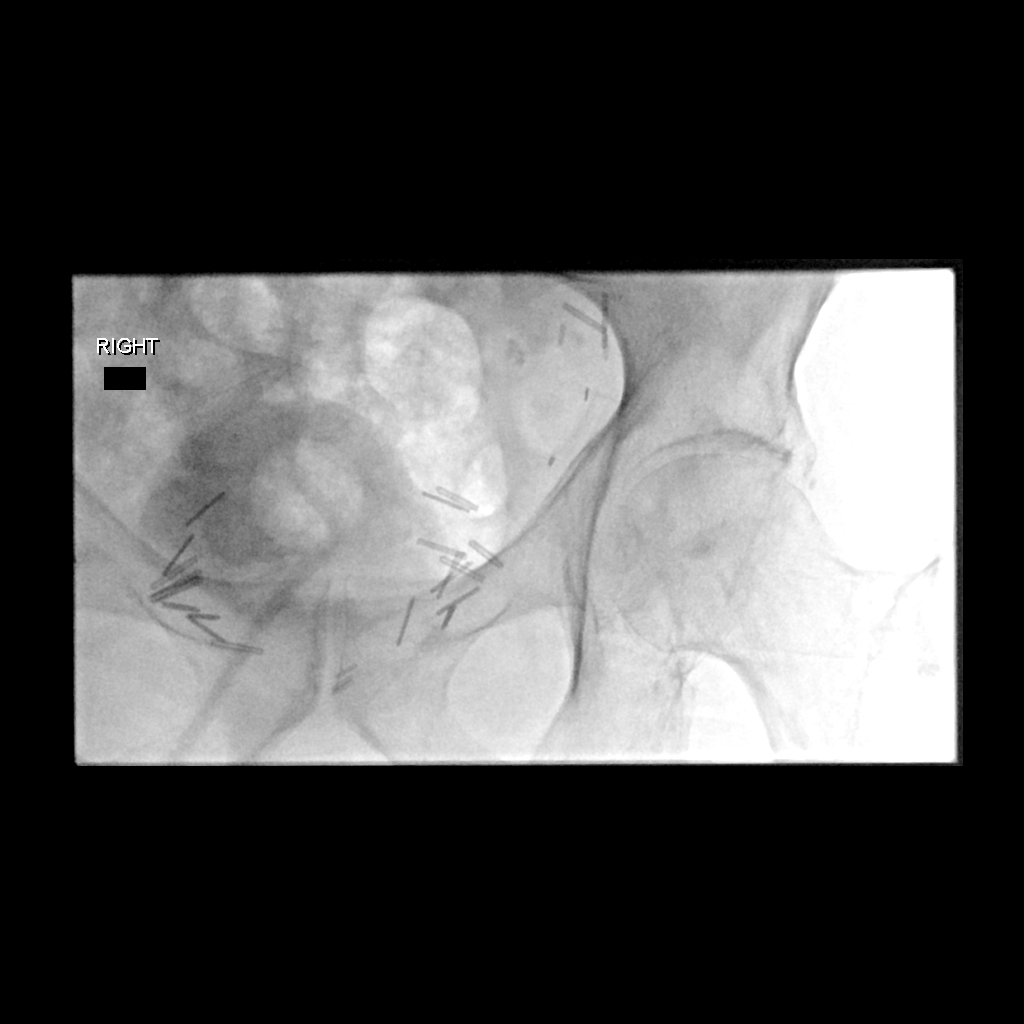
[im 2/2]
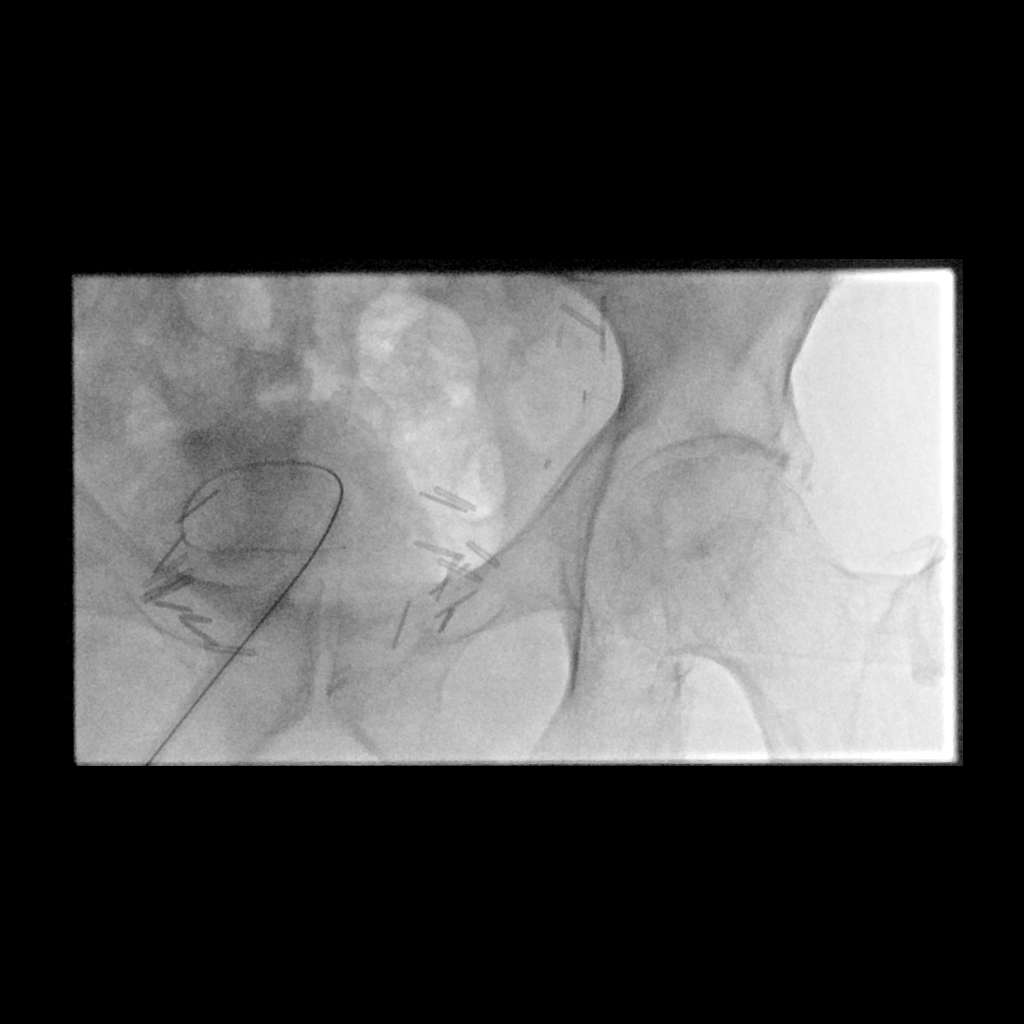

[3 of 3 positions shown; findings below may reference images not displayed]

MEDICATIONS:
None.

ANESTHESIA/SEDATION:
None.

CONTRAST:  5mL OMNIPAQUE IOHEXOL 300 MG/ML SOLN - administered into
the collecting system(s)

FLUOROSCOPY TIME:  Radiation exposure index: 1 mGy reference air
kerma

COMPLICATIONS:
None immediate.

PROCEDURE:
Informed written consent was obtained from the patient after a
thorough discussion of the procedural risks, benefits and
alternatives. All questions were addressed. Maximal Sterile Barrier
Technique was utilized including caps, mask, sterile gowns, sterile
gloves, sterile drape, hand hygiene and skin antiseptic. A timeout
was performed prior to the initiation of the procedure.

Contrast was injected through the existing catheter which opacified
the bladder confirming appropriate positioning of the catheter. The
retention suture was cut. The tube was transected and removed over
an Amplatz wire. The percutaneous tract was then dilated to 18
French. A 16 French Foley catheter was lubricated and advanced over
the wire (placed on the wire utilizing a 19 gauge single hole
arterial puncture needle) and into the bladder lumen. The retention
balloon was filled with 10 mL saline. Images were obtained and
stored for the medical record. Urine return is excellent. The
catheter was attached to a Foley bag.
IMPRESSION: Successful upsize and exchange to a 16 French Foley catheter.

## 2023-08-26 IMAGING — DX DG CHEST 1V PORT
2 series · 2 of 2 positions shown · non-contrast
Comparison: February 21, 2022.

CLINICAL DATA: Atrial fibrillation with chest pain, history of
Parkinson's.

EXAM:
PORTABLE CHEST 1 VIEW

[chest ap (1 of 2)]
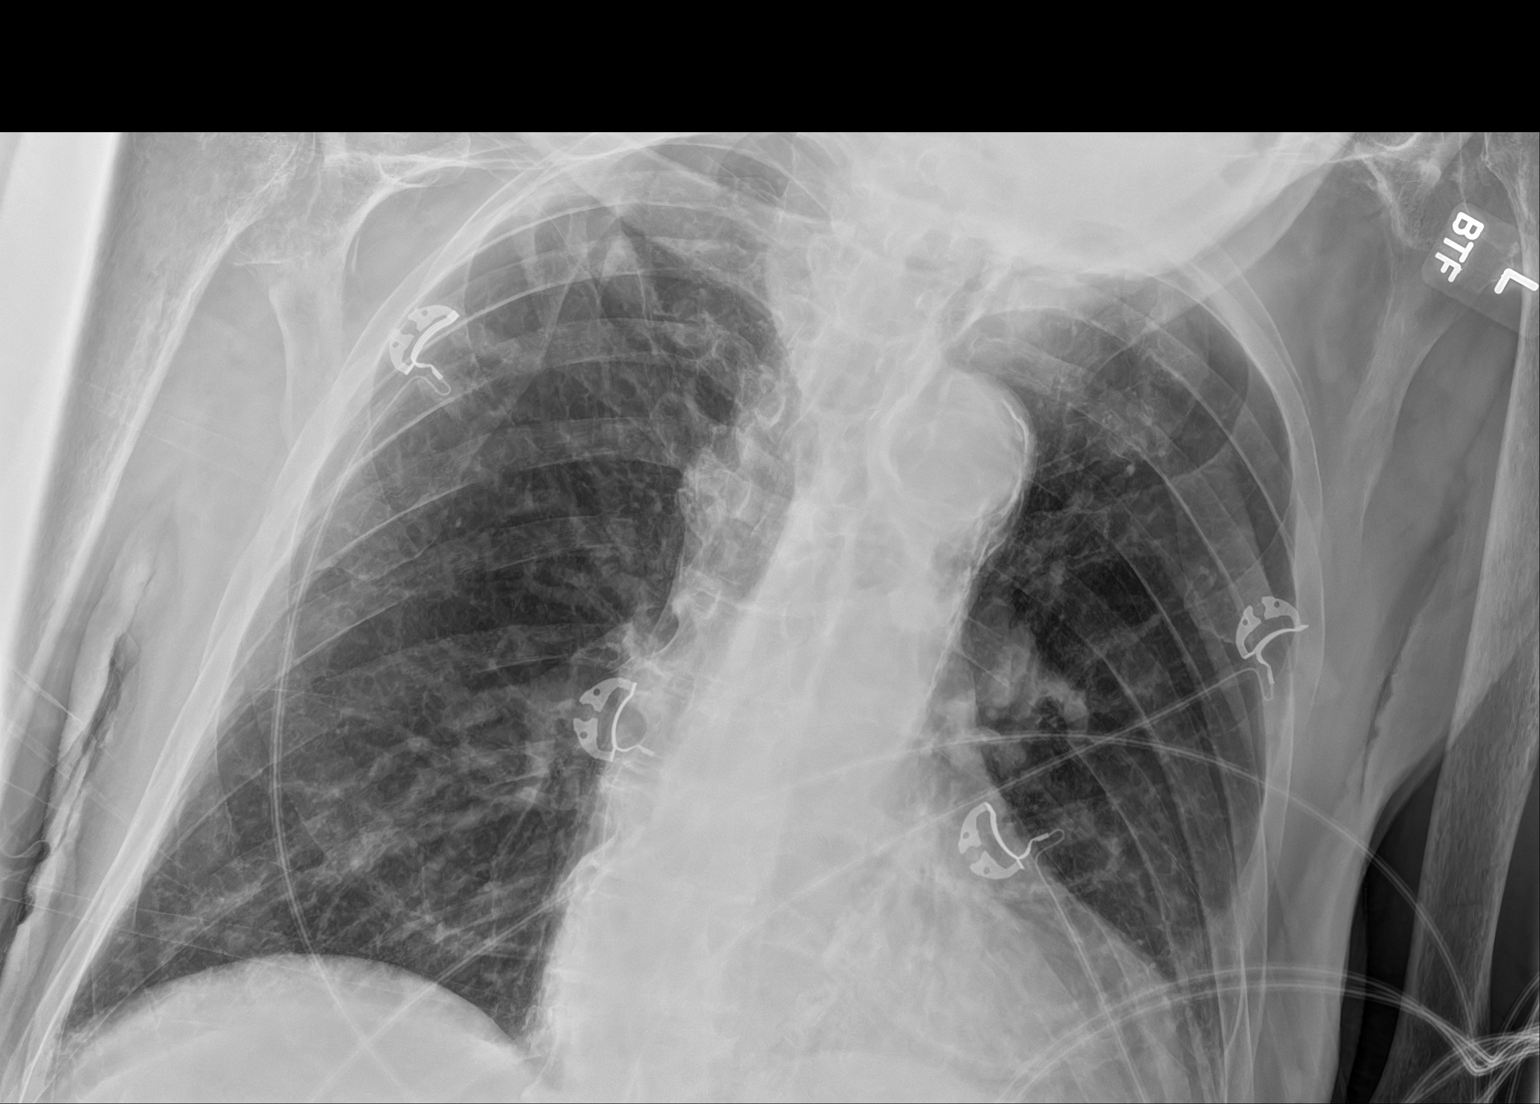

[chest ap (2 of 2)]
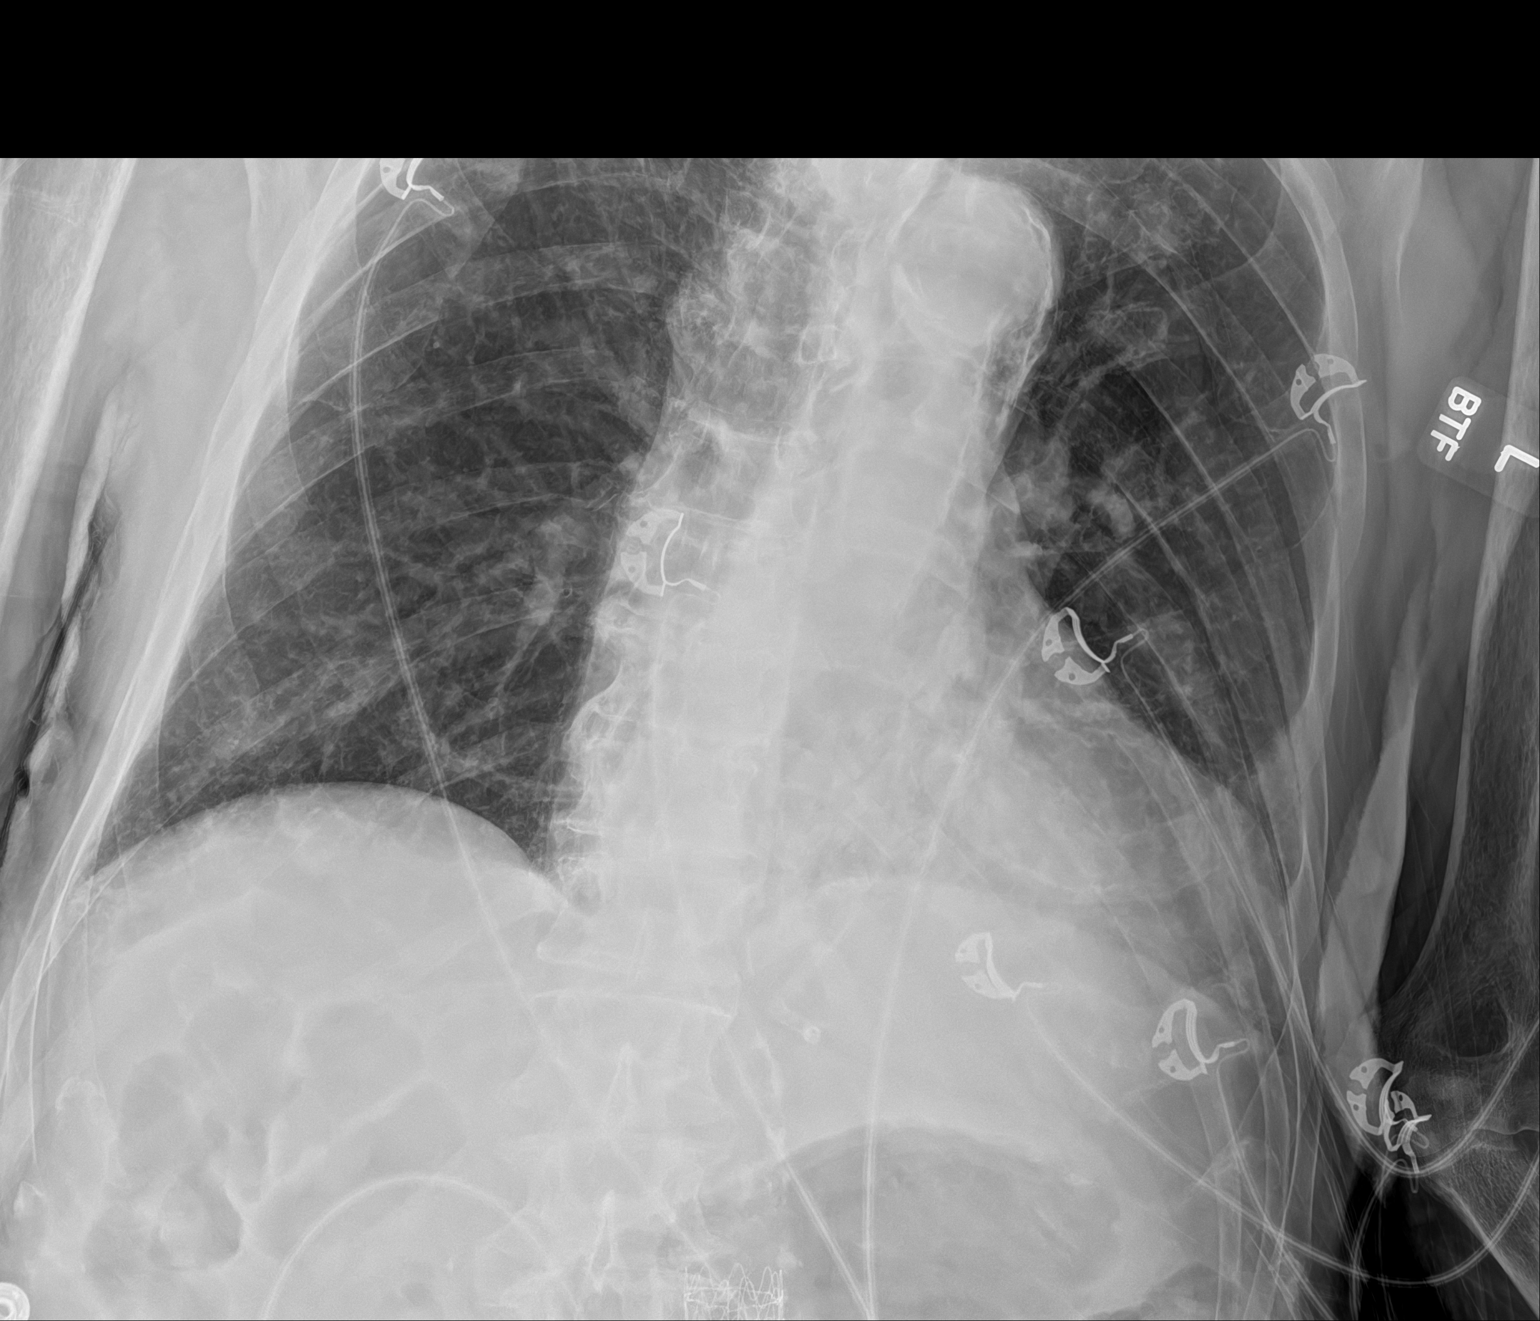

[2 of 2 positions shown; findings below may reference images not displayed]

FINDINGS: Image rotated the LEFT, limited by patient positioning related to
patient condition by report.

EKG leads project over the chest.

No lobar consolidation. Stable scarring in the RIGHT upper lobe. No
sign of pleural effusion or pneumothorax.

Osteopenia without acute skeletal process.
IMPRESSION: No active cardiopulmonary disease.
